# Patient Record
Sex: Female | Born: 1945 | Race: White | Hispanic: No | Smoking: Former smoker
Health system: Southern US, Community
[De-identification: ages and names within clinical notes are randomized; demographics above are authoritative.]

## PROBLEM LIST (undated history)

## (undated) DIAGNOSIS — E039 Hypothyroidism, unspecified: Secondary | ICD-10-CM

## (undated) DIAGNOSIS — F32A Depression, unspecified: Secondary | ICD-10-CM

## (undated) DIAGNOSIS — E785 Hyperlipidemia, unspecified: Secondary | ICD-10-CM

## (undated) DIAGNOSIS — F329 Major depressive disorder, single episode, unspecified: Secondary | ICD-10-CM

## (undated) HISTORY — DX: Hyperlipidemia, unspecified: E78.5

## (undated) HISTORY — PX: THYROID SURGERY: SHX805

## (undated) HISTORY — DX: Hypothyroidism, unspecified: E03.9

## (undated) HISTORY — DX: Depression, unspecified: F32.A

## (undated) HISTORY — PX: TUBAL LIGATION: SHX77

## (undated) HISTORY — DX: Major depressive disorder, single episode, unspecified: F32.9

## (undated) HISTORY — PX: SHOULDER SURGERY: SHX246

---

## 2002-09-26 HISTORY — PX: CATARACT EXTRACTION: SUR2

## 2009-04-23 ENCOUNTER — Emergency Department: Payer: Self-pay | Admitting: Emergency Medicine

## 2011-07-11 ENCOUNTER — Ambulatory Visit: Payer: Self-pay

## 2011-07-14 ENCOUNTER — Emergency Department: Payer: Self-pay | Admitting: Emergency Medicine

## 2011-07-15 ENCOUNTER — Ambulatory Visit: Payer: Self-pay | Admitting: Orthopedic Surgery

## 2012-02-28 LAB — HM PAP SMEAR

## 2012-03-05 LAB — HM COLONOSCOPY

## 2012-03-14 ENCOUNTER — Ambulatory Visit: Payer: Self-pay

## 2012-09-09 ENCOUNTER — Ambulatory Visit: Payer: Self-pay

## 2012-11-03 IMAGING — CT CT CERVICAL SPINE WITHOUT CONTRAST
3 series · 16 of 33 positions shown, 19 images · non-contrast
Comparison: none

REASON FOR EXAM: fall with pain
COMMENTS:

[Series 3: axial · axial · 0.33mm/px · z∈[+1119,+1243]mm · 8 of 83 slices shown, 10 images]
[im 7/83  soft-tissue]
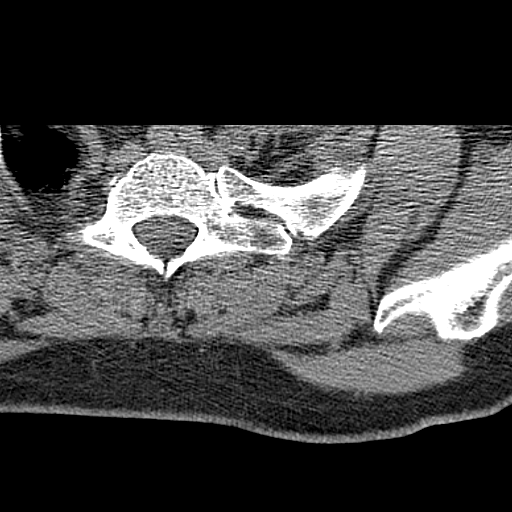
[im 7/83  bone]
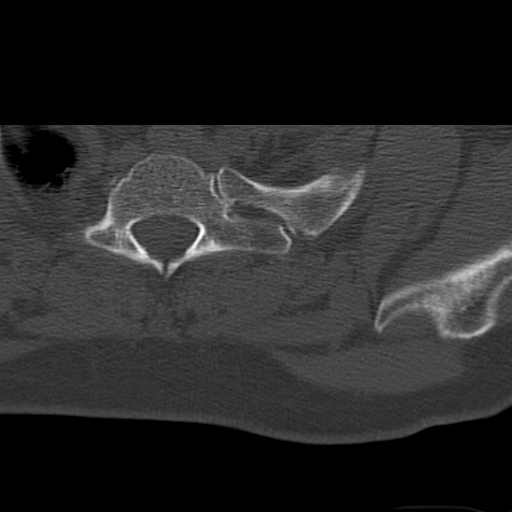
[im 19/83  bone]
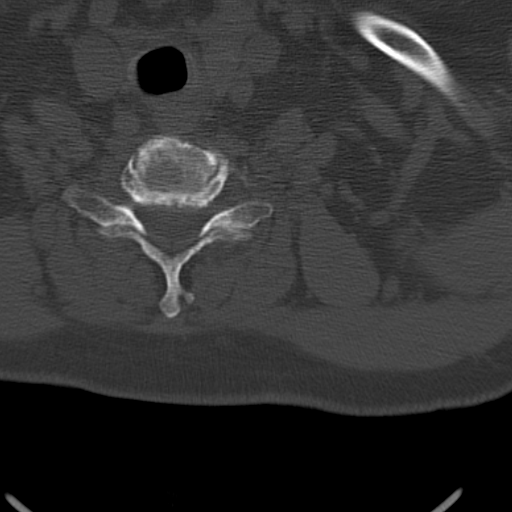
[im 26/83  bone]
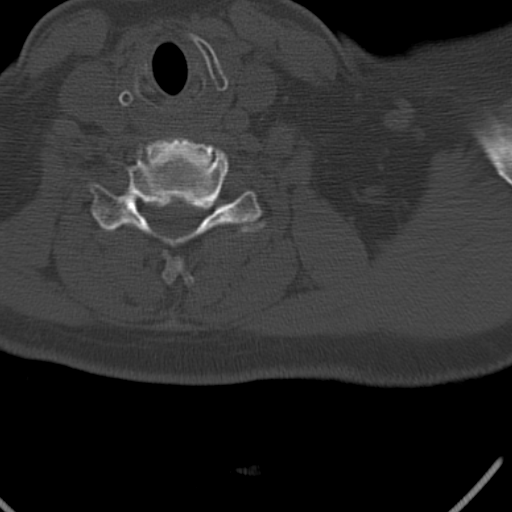
[im 38/83  bone]
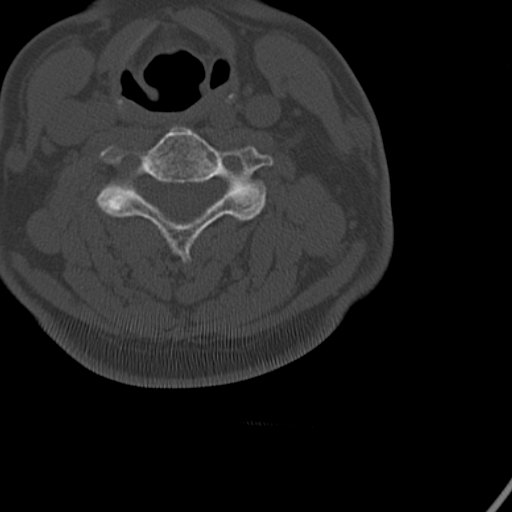
[im 45/83  soft-tissue]
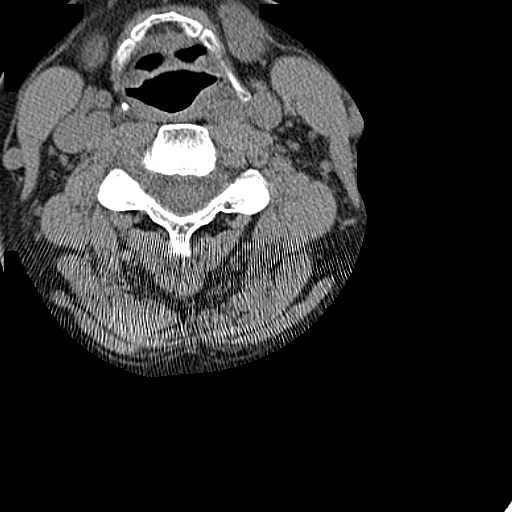
[im 45/83  bone]
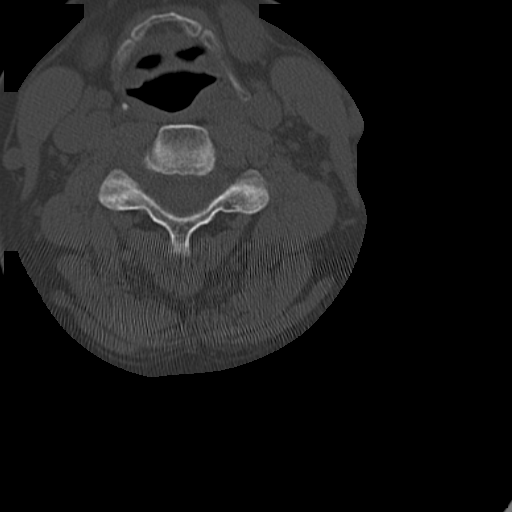
[im 57/83  bone]
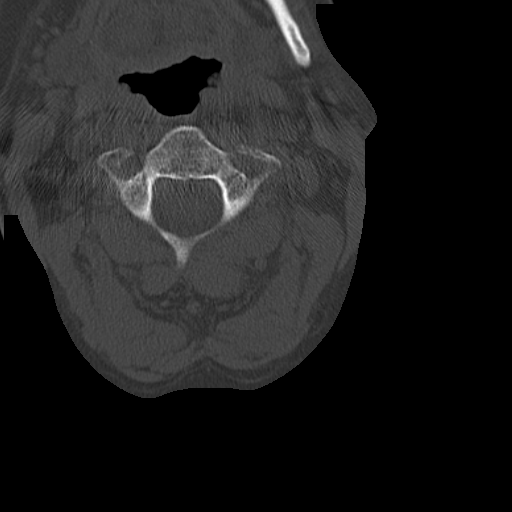
[im 64/83  bone]
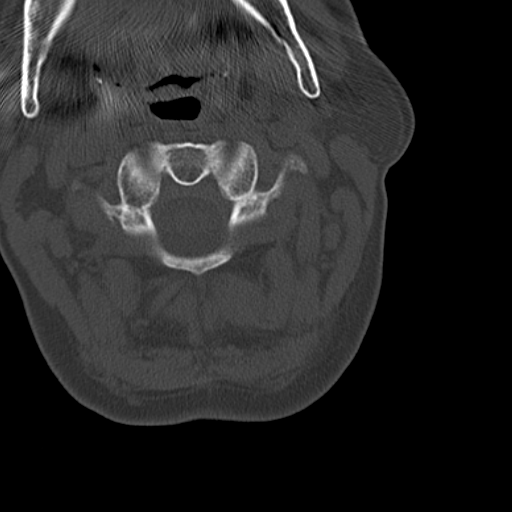
[im 76/83  bone]
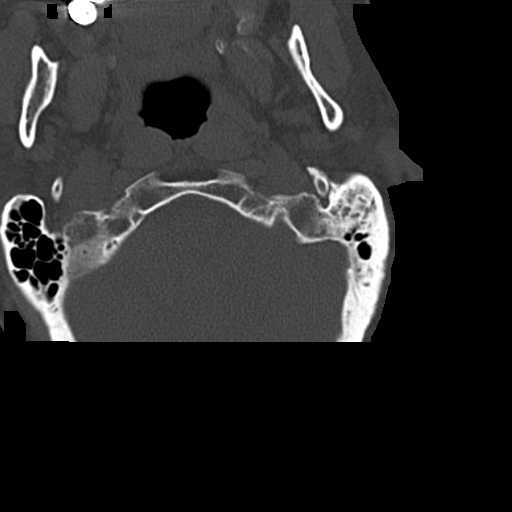

[Series 4: sagittal · sagittal · 0.37mm/px · 5 of 48 slices shown, 6 images]
[im 16/48  bone]
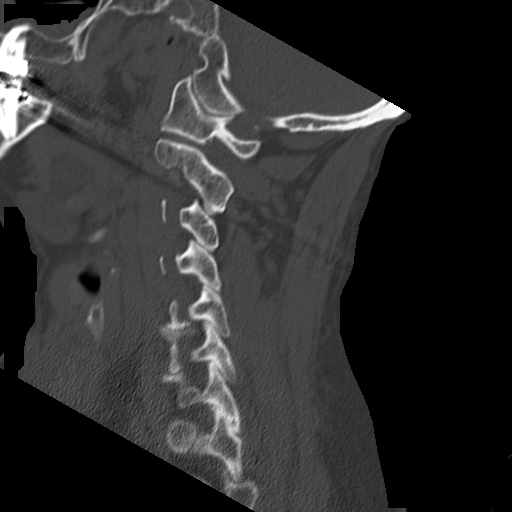
[im 20/48  bone]
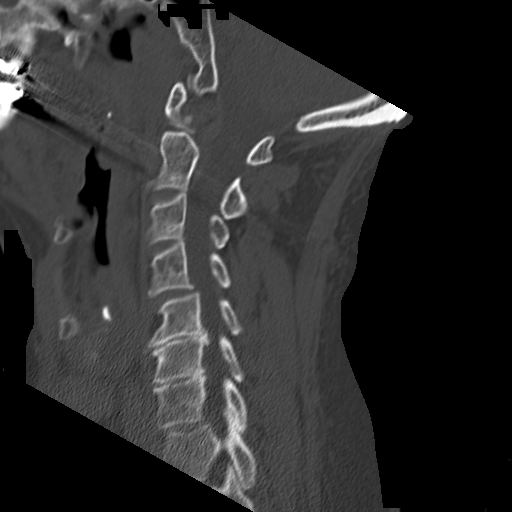
[im 24/48  soft-tissue]
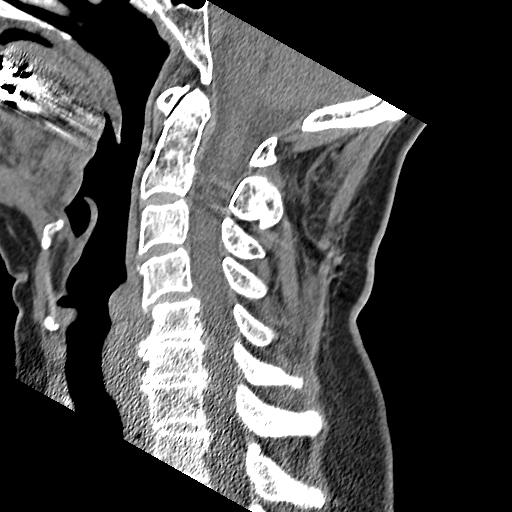
[im 24/48  bone]
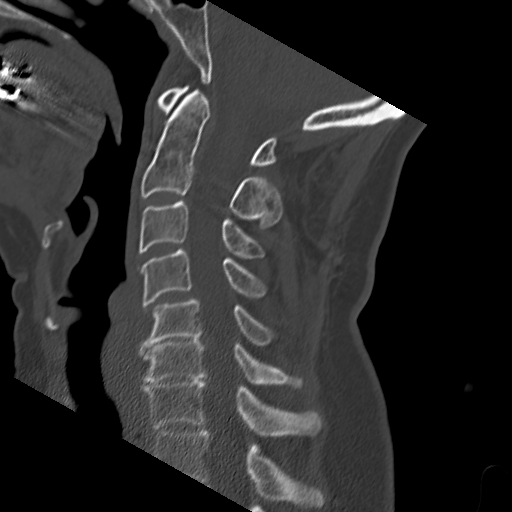
[im 28/48  bone]
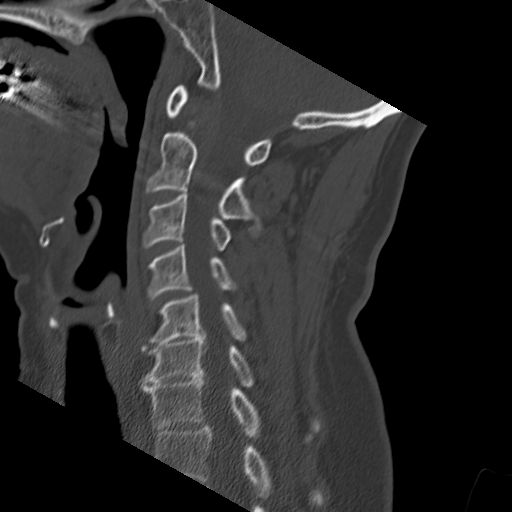
[im 32/48  bone]
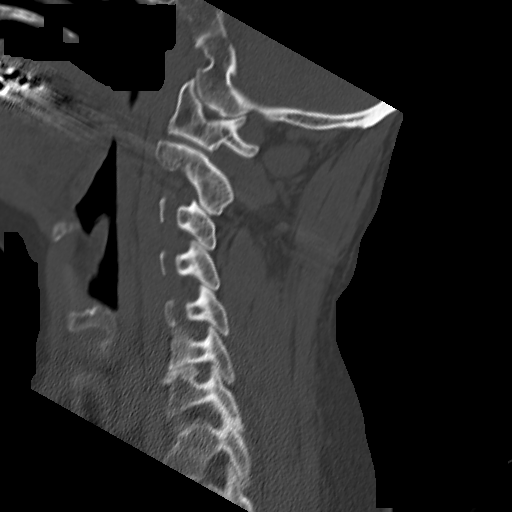

[Series 5: coronal · coronal · 0.38mm/px · 3 of 41 slices shown]
[im 9/41  bone]
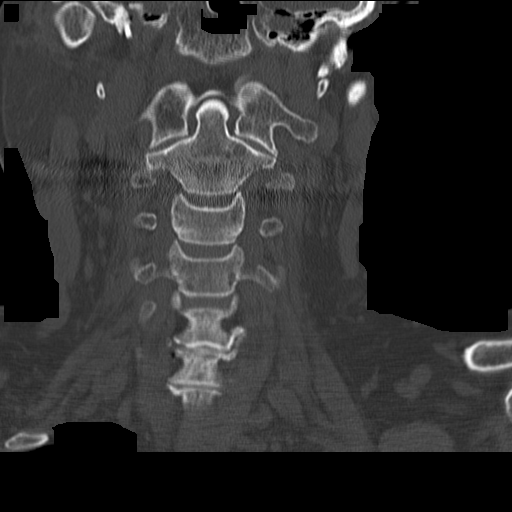
[im 17/41  bone]
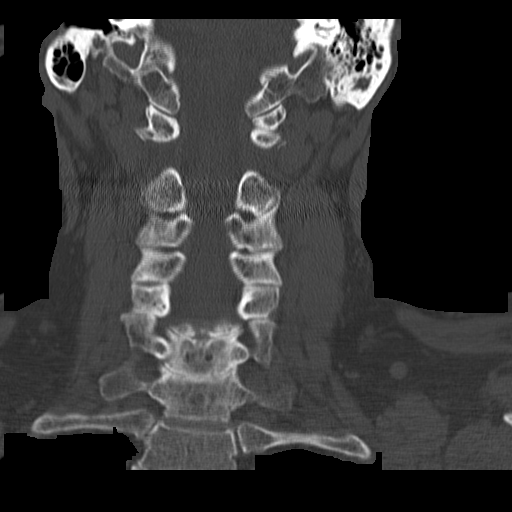
[im 25/41  bone]
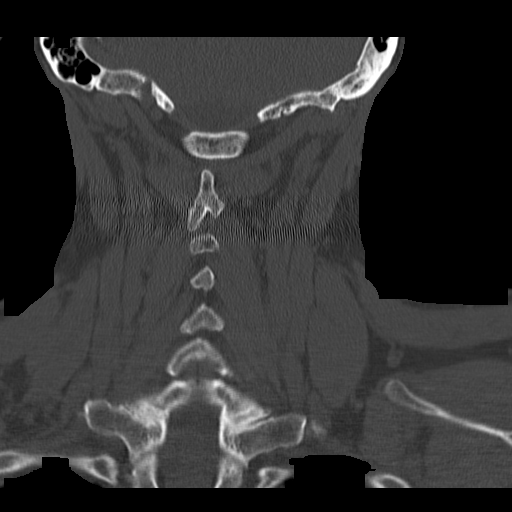

[16 of 33 positions shown; findings below may reference images not displayed]

PROCEDURE:     CT  - CT CERVICAL SPINE WO  - July 14, 2011 [DATE]

RESULT:     Sagittal, axial, and coronal images through the cervical spine
are reviewed.

There is mild loss of the normal cervical lordosis. The cervical vertebral
bodies are preserved in height. There is disc space narrowing at C5-C6 and
at C6-C7. The prevertebral soft tissue spaces appear normal. There is no
evidence of a jumped facet. The spinous processes are intact. The lateral
masses of C1 align normally with those of C2. The odontoid is intact. The
bony ring at each cervical level is intact. There is no bony spinal canal
stenosis. There is pleural thickening in the pulmonary apices bilaterally.
IMPRESSION: 1. I do not see evidence of an acute cervical spine fracture nor
dislocation. Mild reversal of the normal cervical lordosis may reflect
muscle spasm.
2. There is moderate degenerative disc change at C5-C6 and at C6-C7. There
is no bony spinal canal stenosis. At C5-C6 there is mild encroachment upon
the neural foramina bilaterally due to facet joint osteophytes.

## 2012-11-03 IMAGING — CT CT HEAD WITHOUT CONTRAST
2 series · 15 of 30 positions shown, 19 images · non-contrast
Comparison: none

REASON FOR EXAM: pain fall
COMMENTS:

[Series 2: without · axial · non-contrast · 0.44mm/px · z∈[+1265,+1410]mm · 13 of 35 slices shown, 17 images]
[im 3/35  brain]
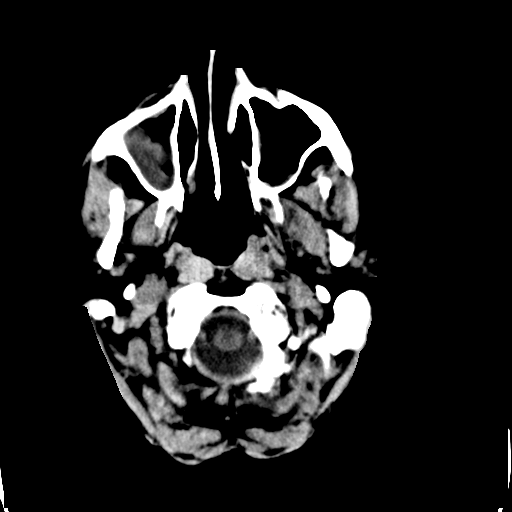
[im 3/35  bone]
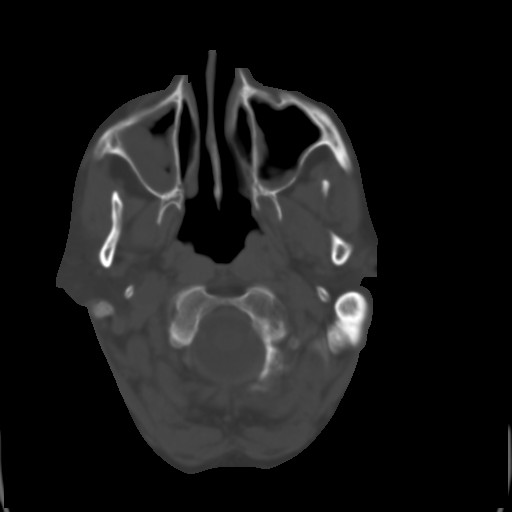
[im 5/35  brain]
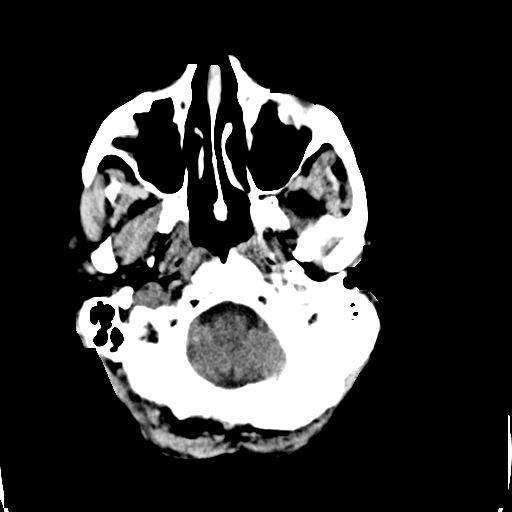
[im 8/35  brain]
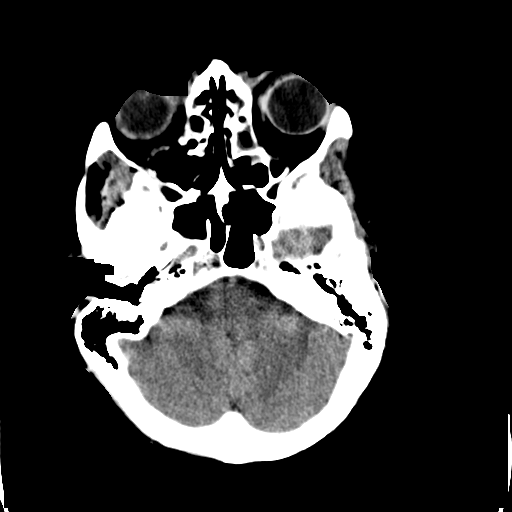
[im 10/35  brain]
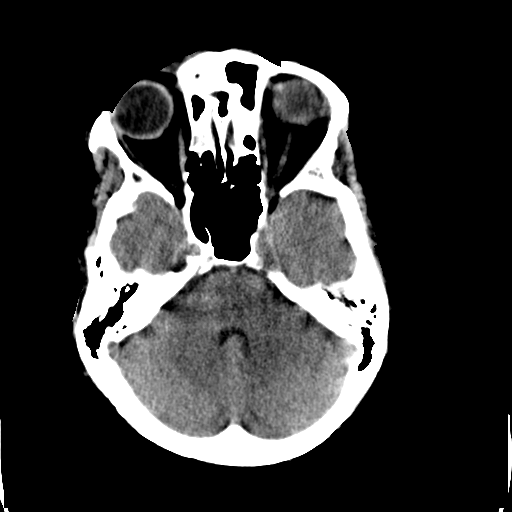
[im 13/35  brain]
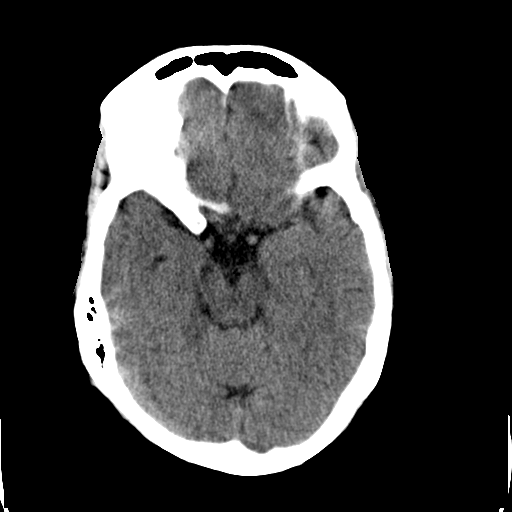
[im 13/35  bone]
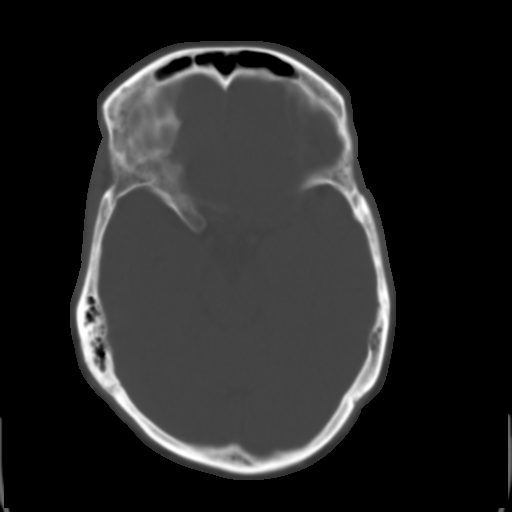
[im 15/35  brain]
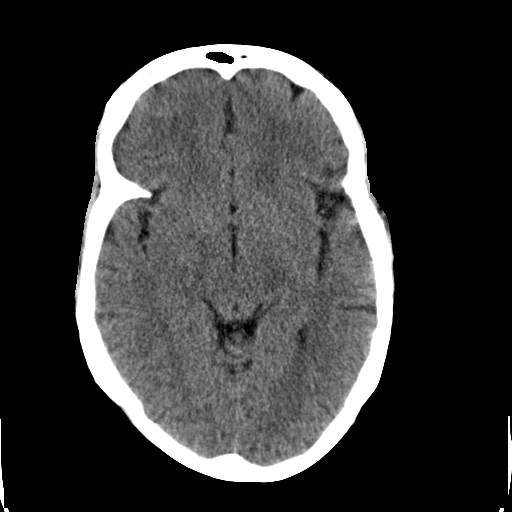
[im 18/35  brain]
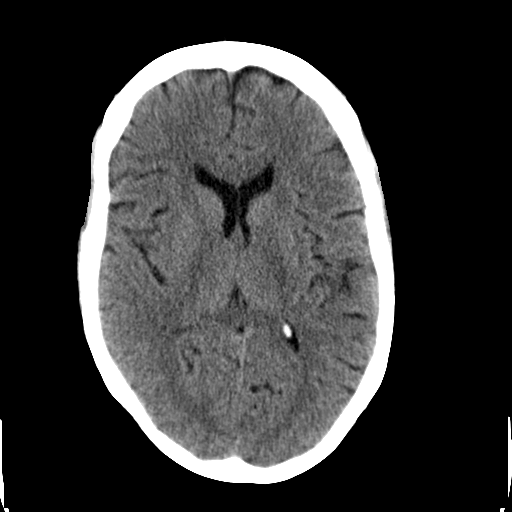
[im 20/35  brain]
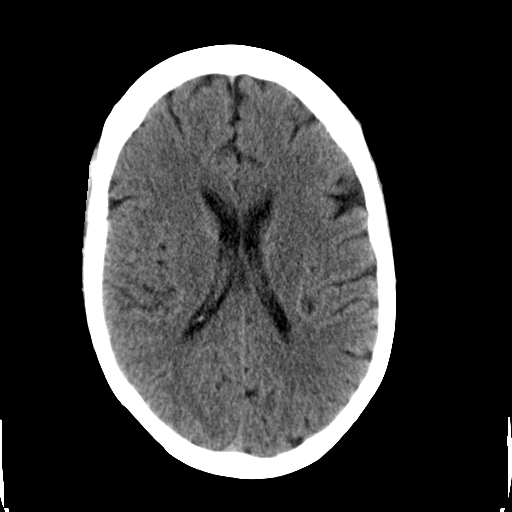
[im 22/35  brain]
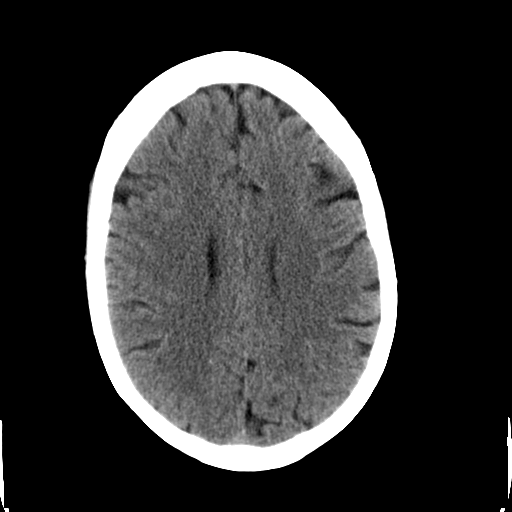
[im 22/35  bone]
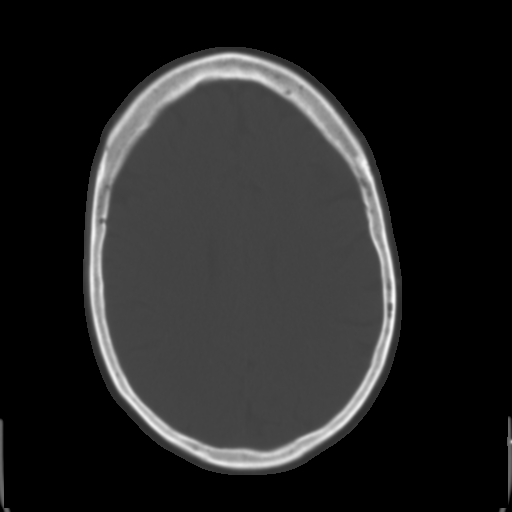
[im 25/35  brain]
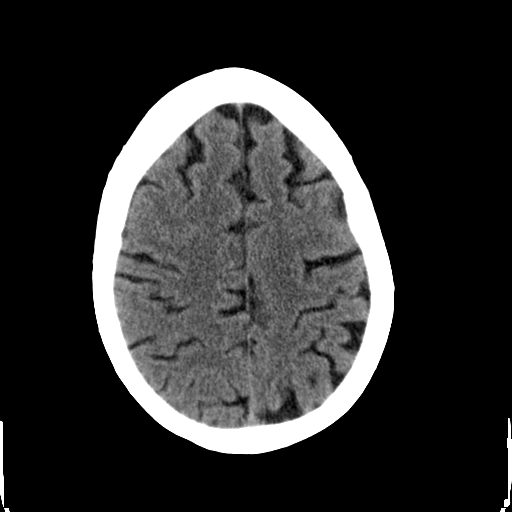
[im 27/35  brain]
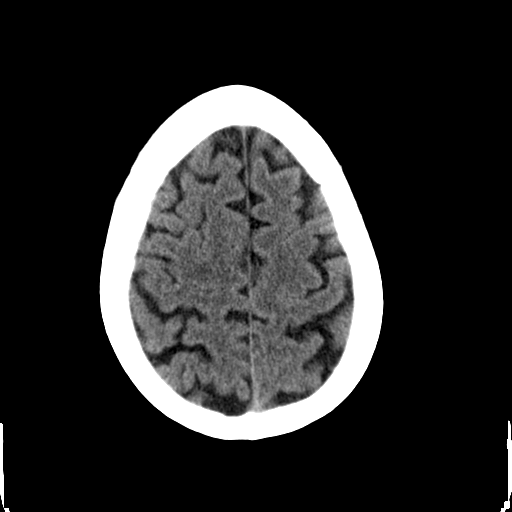
[im 30/35  brain]
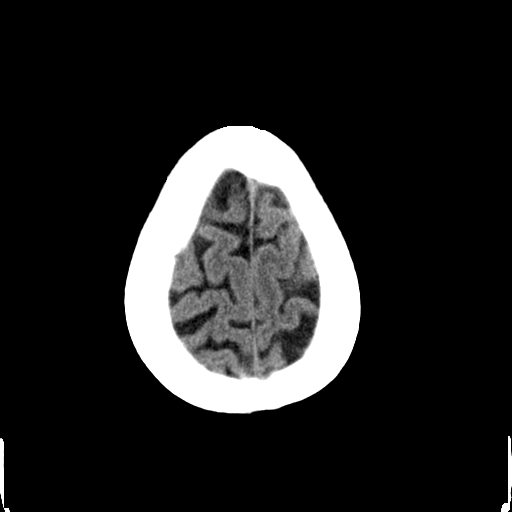
[im 32/35  brain]
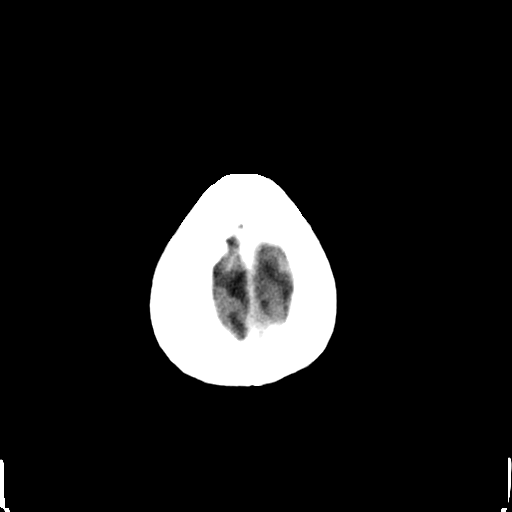
[im 32/35  bone]
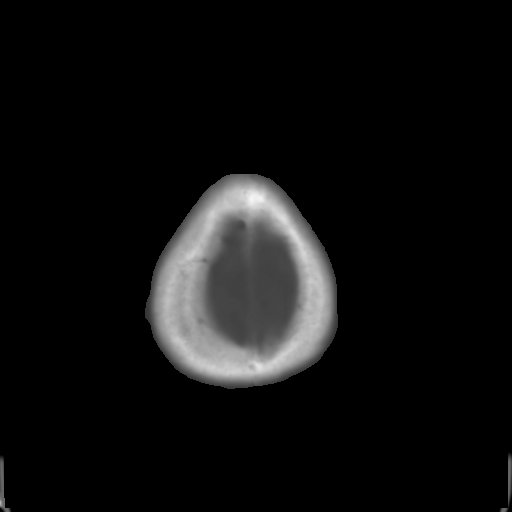

[Series 3: bone · axial · 0.44mm/px · z∈[+1265,+1290]mm · 2 of 35 slices shown]
[im 3/35  bone]
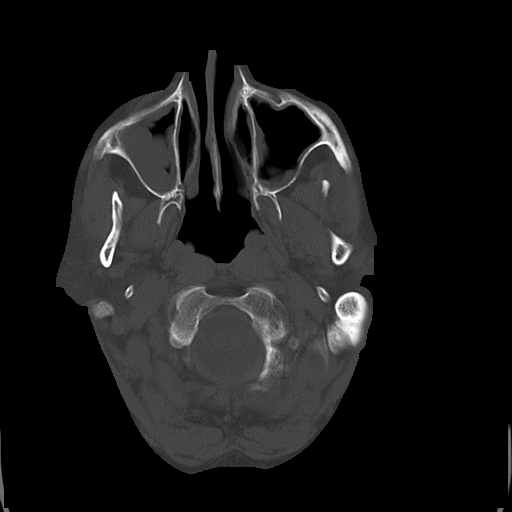
[im 8/35  bone]
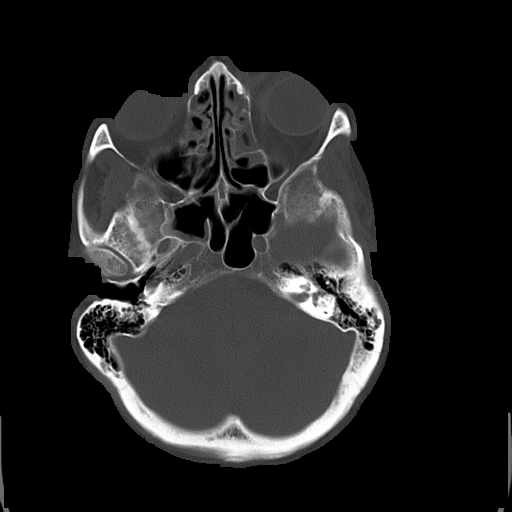

[15 of 30 positions shown; findings below may reference images not displayed]

PROCEDURE:     CT  - CT HEAD WITHOUT CONTRAST  - July 14, 2011 [DATE]

RESULT:     Axial CT scanning was performed through the brain at 5 mm
intervals and slice thicknesses.

The ventricles are normal in size and position. There is no intracranial
hemorrhage nor intracranial mass effect. There is no evidence of an evolving
ischemic infarction. There are punctate basal ganglia calcifications
bilaterally. The cerebellum and brainstem are normal in density. At bone
window settings there is mucoperiosteal thickening within the ethmoid and
maxillary sinuses bilaterally. I see no air-fluid levels. The mastoid air
cells are well pneumatized. There is no evidence of an acute skull fracture.
IMPRESSION: 1. I do not see evidence of an acute intracranial hemorrhage nor other acute
intracranial abnormality.
2. There is no evidence of an acute skull fracture.
3. Soft tissue density in the ethmoid and maxillary sinuses suggests sinus
inflammation.

## 2013-01-08 DIAGNOSIS — J309 Allergic rhinitis, unspecified: Secondary | ICD-10-CM | POA: Diagnosis not present

## 2013-01-08 DIAGNOSIS — M67919 Unspecified disorder of synovium and tendon, unspecified shoulder: Secondary | ICD-10-CM | POA: Diagnosis not present

## 2013-01-08 DIAGNOSIS — M719 Bursopathy, unspecified: Secondary | ICD-10-CM | POA: Diagnosis not present

## 2013-04-02 DIAGNOSIS — E039 Hypothyroidism, unspecified: Secondary | ICD-10-CM | POA: Diagnosis not present

## 2013-04-02 DIAGNOSIS — Z23 Encounter for immunization: Secondary | ICD-10-CM | POA: Diagnosis not present

## 2013-04-02 DIAGNOSIS — I10 Essential (primary) hypertension: Secondary | ICD-10-CM | POA: Diagnosis not present

## 2013-04-02 DIAGNOSIS — Z0000 Encounter for general adult medical examination without abnormal findings: Secondary | ICD-10-CM | POA: Diagnosis not present

## 2013-04-02 DIAGNOSIS — E785 Hyperlipidemia, unspecified: Secondary | ICD-10-CM | POA: Diagnosis not present

## 2014-03-18 DIAGNOSIS — E039 Hypothyroidism, unspecified: Secondary | ICD-10-CM | POA: Diagnosis not present

## 2014-03-18 DIAGNOSIS — B351 Tinea unguium: Secondary | ICD-10-CM | POA: Diagnosis not present

## 2014-03-18 DIAGNOSIS — E785 Hyperlipidemia, unspecified: Secondary | ICD-10-CM | POA: Diagnosis not present

## 2014-11-07 DIAGNOSIS — J019 Acute sinusitis, unspecified: Secondary | ICD-10-CM | POA: Diagnosis not present

## 2014-11-21 DIAGNOSIS — Z0000 Encounter for general adult medical examination without abnormal findings: Secondary | ICD-10-CM | POA: Diagnosis not present

## 2014-11-21 DIAGNOSIS — E780 Pure hypercholesterolemia: Secondary | ICD-10-CM | POA: Diagnosis not present

## 2014-11-21 DIAGNOSIS — Z23 Encounter for immunization: Secondary | ICD-10-CM | POA: Diagnosis not present

## 2014-11-21 DIAGNOSIS — E039 Hypothyroidism, unspecified: Secondary | ICD-10-CM | POA: Diagnosis not present

## 2014-11-21 DIAGNOSIS — J069 Acute upper respiratory infection, unspecified: Secondary | ICD-10-CM | POA: Diagnosis not present

## 2014-12-24 DIAGNOSIS — M8588 Other specified disorders of bone density and structure, other site: Secondary | ICD-10-CM | POA: Diagnosis not present

## 2014-12-24 DIAGNOSIS — Z780 Asymptomatic menopausal state: Secondary | ICD-10-CM | POA: Diagnosis not present

## 2014-12-24 LAB — HM DEXA SCAN

## 2015-03-18 ENCOUNTER — Ambulatory Visit (INDEPENDENT_AMBULATORY_CARE_PROVIDER_SITE_OTHER): Payer: Medicare Other | Admitting: Unknown Physician Specialty

## 2015-03-18 ENCOUNTER — Telehealth: Payer: Self-pay | Admitting: Unknown Physician Specialty

## 2015-03-18 ENCOUNTER — Encounter: Payer: Self-pay | Admitting: Unknown Physician Specialty

## 2015-03-18 VITALS — BP 117/79 | HR 91 | Temp 99.1°F | Ht 67.5 in | Wt 228.8 lb

## 2015-03-18 DIAGNOSIS — F419 Anxiety disorder, unspecified: Secondary | ICD-10-CM | POA: Insufficient documentation

## 2015-03-18 DIAGNOSIS — J069 Acute upper respiratory infection, unspecified: Secondary | ICD-10-CM

## 2015-03-18 DIAGNOSIS — E785 Hyperlipidemia, unspecified: Secondary | ICD-10-CM | POA: Insufficient documentation

## 2015-03-18 DIAGNOSIS — J45909 Unspecified asthma, uncomplicated: Secondary | ICD-10-CM | POA: Insufficient documentation

## 2015-03-18 DIAGNOSIS — F329 Major depressive disorder, single episode, unspecified: Secondary | ICD-10-CM | POA: Insufficient documentation

## 2015-03-18 DIAGNOSIS — E063 Autoimmune thyroiditis: Secondary | ICD-10-CM | POA: Insufficient documentation

## 2015-03-18 DIAGNOSIS — F32A Depression, unspecified: Secondary | ICD-10-CM | POA: Insufficient documentation

## 2015-03-18 DIAGNOSIS — E039 Hypothyroidism, unspecified: Secondary | ICD-10-CM | POA: Insufficient documentation

## 2015-03-18 MED ORDER — BENZONATATE 200 MG PO CAPS: 200.0000 mg | ORAL_CAPSULE | Freq: Two times a day (BID) | ORAL | Status: DC | PRN

## 2015-03-18 NOTE — Telephone Encounter (Signed)
Pt has been added to Cheryl's schedule today @ 2pm for an acute visit. Thanks.

## 2015-03-18 NOTE — Patient Instructions (Signed)
Upper Respiratory Infection, Adult An upper respiratory infection (URI) is also sometimes known as the common cold. The upper respiratory tract includes the nose, sinuses, throat, trachea, and bronchi. Bronchi are the airways leading to the lungs. Most people improve within 1 week, but symptoms can last up to 2 weeks. A residual cough may last even longer.  CAUSES Many different viruses can infect the tissues lining the upper respiratory tract. The tissues become irritated and inflamed and often become very moist. Mucus production is also common. A cold is contagious. You can easily spread the virus to others by oral contact. This includes kissing, sharing a glass, coughing, or sneezing. Touching your mouth or nose and then touching a surface, which is then touched by another person, can also spread the virus. SYMPTOMS  Symptoms typically develop 1 to 3 days after you come in contact with a cold virus. Symptoms vary from person to person. They may include:  Runny nose.  Sneezing.  Nasal congestion.  Sinus irritation.  Sore throat.  Loss of voice (laryngitis).  Cough.  Fatigue.  Muscle aches.  Loss of appetite.  Headache.  Low-grade fever. DIAGNOSIS  You might diagnose your own cold based on familiar symptoms, since most people get a cold 2 to 3 times a year. Your caregiver can confirm this based on your exam. Most importantly, your caregiver can check that your symptoms are not due to another disease such as strep throat, sinusitis, pneumonia, asthma, or epiglottitis. Blood tests, throat tests, and X-rays are not necessary to diagnose a common cold, but they may sometimes be helpful in excluding other more serious diseases. Your caregiver will decide if any further tests are required. RISKS AND COMPLICATIONS  You may be at risk for a more severe case of the common cold if you smoke cigarettes, have chronic heart disease (such as heart failure) or lung disease (such as asthma), or if  you have a weakened immune system. The very young and very old are also at risk for more serious infections. Bacterial sinusitis, middle ear infections, and bacterial pneumonia can complicate the common cold. The common cold can worsen asthma and chronic obstructive pulmonary disease (COPD). Sometimes, these complications can require emergency medical care and may be life-threatening. PREVENTION  The best way to protect against getting a cold is to practice good hygiene. Avoid oral or hand contact with people with cold symptoms. Wash your hands often if contact occurs. There is no clear evidence that vitamin C, vitamin E, echinacea, or exercise reduces the chance of developing a cold. However, it is always recommended to get plenty of rest and practice good nutrition. TREATMENT  Treatment is directed at relieving symptoms. There is no cure. Antibiotics are not effective, because the infection is caused by a virus, not by bacteria. Treatment may include:  Increased fluid intake. Sports drinks offer valuable electrolytes, sugars, and fluids.  Breathing heated mist or steam (vaporizer or shower).  Eating chicken soup or other clear broths, and maintaining good nutrition.  Getting plenty of rest.  Using gargles or lozenges for comfort.  Controlling fevers with ibuprofen or acetaminophen as directed by your caregiver.  Increasing usage of your inhaler if you have asthma. Zinc gel and zinc lozenges, taken in the first 24 hours of the common cold, can shorten the duration and lessen the severity of symptoms. Pain medicines may help with fever, muscle aches, and throat pain. A variety of non-prescription medicines are available to treat congestion and runny nose. Your caregiver   can make recommendations and may suggest nasal or lung inhalers for other symptoms.  HOME CARE INSTRUCTIONS   Only take over-the-counter or prescription medicines for pain, discomfort, or fever as directed by your  caregiver.  Use a warm mist humidifier or inhale steam from a shower to increase air moisture. This may keep secretions moist and make it easier to breathe.  Drink enough water and fluids to keep your urine clear or pale yellow.  Rest as needed.  Return to work when your temperature has returned to normal or as your caregiver advises. You may need to stay home longer to avoid infecting others. You can also use a face mask and careful hand washing to prevent spread of the virus. SEEK MEDICAL CARE IF:   After the first few days, you feel you are getting worse rather than better.  You need your caregiver's advice about medicines to control symptoms.  You develop chills, worsening shortness of breath, or brown or red sputum. These may be signs of pneumonia.  You develop yellow or brown nasal discharge or pain in the face, especially when you bend forward. These may be signs of sinusitis.  You develop a fever, swollen neck glands, pain with swallowing, or white areas in the back of your throat. These may be signs of strep throat. SEEK IMMEDIATE MEDICAL CARE IF:   You have a fever.  You develop severe or persistent headache, ear pain, sinus pain, or chest pain.  You develop wheezing, a prolonged cough, cough up blood, or have a change in your usual mucus (if you have chronic lung disease).  You develop sore muscles or a stiff neck. Document Released: 03/08/2001 Document Revised: 12/05/2011 Document Reviewed: 12/18/2013 [employer] Patient Information 2015 [employer]. This information is not intended to replace advice given to you by your health care provider. Make sure you discuss any questions you have with your health care provider.  

## 2015-03-18 NOTE — Progress Notes (Signed)
   BP 117/79 mmHg  Pulse 91  Temp(Src) 99.1 F (37.3 C)  Ht 5' 7.5" (1.715 m)  Wt 228 lb 12.8 oz (103.783 kg)  BMI 35.29 kg/m2  SpO2 97%  LMP  (LMP Unknown)   Subjective:    Patient ID: Wanda White, female    DOB: 28-Nov-1945, 69 y.o.   MRN: [id number]  HPI: Wanda White is a 69 y.o. female  Chief Complaint  Patient presents with  . Facial Pain  . Ear Pain  . Nasal Congestion  . Cough   URI  This is a new problem. The current episode started yesterday. The problem has been rapidly worsening. There has been no fever. Associated symptoms include chest pain, congestion, coughing and headaches. Pertinent negatives include no abdominal pain, diarrhea, dysuria, ear pain or wheezing. She has tried decongestant for the symptoms. The treatment provided mild relief.    Relevant past medical, surgical, family and social history reviewed and updated as indicated. Interim medical history since our last visit reviewed. Allergies and medications reviewed and updated.  Review of Systems  HENT: Positive for congestion. Negative for ear pain.   Respiratory: Positive for cough. Negative for wheezing.   Cardiovascular: Positive for chest pain.  Gastrointestinal: Negative for abdominal pain and diarrhea.  Genitourinary: Negative for dysuria.  Neurological: Positive for headaches.    Per HPI unless specifically indicated above     Objective:    BP 117/79 mmHg  Pulse 91  Temp(Src) 99.1 F (37.3 C)  Ht 5' 7.5" (1.715 m)  Wt 228 lb 12.8 oz (103.783 kg)  BMI 35.29 kg/m2  SpO2 97%  LMP  (LMP Unknown)  Wt Readings from Last 3 Encounters:  03/18/15 228 lb 12.8 oz (103.783 kg)  11/21/14 227 lb (102.967 kg)    Physical Exam  Constitutional: She is oriented to person, place, and time. She appears well-developed and well-nourished. No distress.  HENT:  Head: Normocephalic and atraumatic.  Right Ear: Tympanic membrane normal.  Left Ear: Tympanic membrane normal.  Nose: Mucosal edema  and rhinorrhea present.  Eyes: Conjunctivae and lids are normal. Right eye exhibits no discharge. Left eye exhibits no discharge. No scleral icterus.  Cardiovascular: Normal rate and regular rhythm.   Pulmonary/Chest: Effort normal. No respiratory distress.  Abdominal: Normal appearance. There is no splenomegaly or hepatomegaly. There is no tenderness.  Musculoskeletal: Normal range of motion.  Neurological: She is alert and oriented to person, place, and time.  Skin: Skin is intact. No rash noted. No pallor.  Psychiatric: She has a normal mood and affect. Her behavior is normal. Judgment and thought content normal.    No results found for this or any previous visit.    Assessment & Plan:   Problem List Items Addressed This Visit    None    Visit Diagnoses    URI (upper respiratory infection)    -  Primary    rx for Tessalon Perles        Follow up plan: Return if symptoms worsen or fail to improve.   Pt ed given on viruses.  Encouraged saline nasal flushes, fluids, and rest.

## 2015-05-22 ENCOUNTER — Other Ambulatory Visit: Payer: Self-pay | Admitting: Unknown Physician Specialty

## 2015-07-23 DIAGNOSIS — G8929 Other chronic pain: Secondary | ICD-10-CM | POA: Diagnosis not present

## 2015-07-23 DIAGNOSIS — M25512 Pain in left shoulder: Secondary | ICD-10-CM | POA: Diagnosis not present

## 2015-07-23 DIAGNOSIS — S42202A Unspecified fracture of upper end of left humerus, initial encounter for closed fracture: Secondary | ICD-10-CM | POA: Diagnosis not present

## 2015-07-23 DIAGNOSIS — Y33XXXA Other specified events, undetermined intent, initial encounter: Secondary | ICD-10-CM | POA: Diagnosis not present

## 2015-08-24 ENCOUNTER — Other Ambulatory Visit: Payer: Self-pay | Admitting: Unknown Physician Specialty

## 2015-11-17 DIAGNOSIS — Y33XXXD Other specified events, undetermined intent, subsequent encounter: Secondary | ICD-10-CM | POA: Diagnosis not present

## 2015-11-17 DIAGNOSIS — M87022 Idiopathic aseptic necrosis of left humerus: Secondary | ICD-10-CM | POA: Diagnosis not present

## 2015-11-17 DIAGNOSIS — S42202A Unspecified fracture of upper end of left humerus, initial encounter for closed fracture: Secondary | ICD-10-CM | POA: Diagnosis not present

## 2015-11-17 DIAGNOSIS — Z9889 Other specified postprocedural states: Secondary | ICD-10-CM | POA: Diagnosis not present

## 2015-11-20 ENCOUNTER — Other Ambulatory Visit: Payer: Self-pay | Admitting: Unknown Physician Specialty

## 2015-11-20 NOTE — Telephone Encounter (Signed)
Pt needs seen.

## 2015-12-02 DIAGNOSIS — S42202D Unspecified fracture of upper end of left humerus, subsequent encounter for fracture with routine healing: Secondary | ICD-10-CM | POA: Diagnosis not present

## 2015-12-02 DIAGNOSIS — Z01818 Encounter for other preprocedural examination: Secondary | ICD-10-CM | POA: Diagnosis not present

## 2015-12-02 DIAGNOSIS — M87022 Idiopathic aseptic necrosis of left humerus: Secondary | ICD-10-CM | POA: Diagnosis not present

## 2015-12-02 DIAGNOSIS — Y33XXXD Other specified events, undetermined intent, subsequent encounter: Secondary | ICD-10-CM | POA: Diagnosis not present

## 2015-12-03 ENCOUNTER — Encounter: Payer: Self-pay | Admitting: *Deleted

## 2016-01-01 DIAGNOSIS — M87022 Idiopathic aseptic necrosis of left humerus: Secondary | ICD-10-CM | POA: Diagnosis not present

## 2016-01-11 NOTE — Telephone Encounter (Signed)
Pt scheduled for medication follow up 01/15/16 @ 10:30am. Thanks.

## 2016-01-15 ENCOUNTER — Ambulatory Visit: Payer: Medicare Other | Admitting: Unknown Physician Specialty

## 2016-01-19 ENCOUNTER — Ambulatory Visit (INDEPENDENT_AMBULATORY_CARE_PROVIDER_SITE_OTHER): Payer: Medicare Other | Admitting: Unknown Physician Specialty

## 2016-01-19 ENCOUNTER — Encounter: Payer: Self-pay | Admitting: Unknown Physician Specialty

## 2016-01-19 VITALS — BP 137/83 | HR 76 | Temp 97.6°F | Ht 67.5 in | Wt 235.4 lb

## 2016-01-19 DIAGNOSIS — E039 Hypothyroidism, unspecified: Secondary | ICD-10-CM

## 2016-01-19 DIAGNOSIS — Z01818 Encounter for other preprocedural examination: Secondary | ICD-10-CM | POA: Diagnosis not present

## 2016-01-19 DIAGNOSIS — E785 Hyperlipidemia, unspecified: Secondary | ICD-10-CM

## 2016-01-19 DIAGNOSIS — I10 Essential (primary) hypertension: Secondary | ICD-10-CM | POA: Diagnosis not present

## 2016-01-19 NOTE — Progress Notes (Signed)
   BP 137/83 mmHg  Pulse 76  Temp(Src) 97.6 F (36.4 C)  Ht 5' 7.5" (1.715 m)  Wt 235 lb 6.4 oz (106.777 kg)  BMI 36.30 kg/m2  SpO2 98%  LMP  (LMP Unknown)   Subjective:    Patient ID: Wanda White, female    DOB: [birth year], 70 y.o.   MRN: [id number]  HPI: Wanda White is a 70 y.o. female  Chief Complaint  Patient presents with  . Hyperlipidemia  . Hypothyroidism   Hyperlipidemia Using medications without problems: No Muscle aches  Diet compliance: Exercise:  Hypothyroid Some weight gain due to inactivity.    Shoulder  Planning on getting a second shoulder surgery following "shattering it" secondary to a fall.  She states it will be a shoulder replacement.     Relevant past medical, surgical, family and social history reviewed and updated as indicated. Interim medical history since our last visit reviewed. Allergies and medications reviewed and updated.  Review of Systems  Per HPI unless specifically indicated above     Objective:    BP 137/83 mmHg  Pulse 76  Temp(Src) 97.6 F (36.4 C)  Ht 5' 7.5" (1.715 m)  Wt 235 lb 6.4 oz (106.777 kg)  BMI 36.30 kg/m2  SpO2 98%  LMP  (LMP Unknown)  Wt Readings from Last 3 Encounters:  01/19/16 235 lb 6.4 oz (106.777 kg)  03/18/15 228 lb 12.8 oz (103.783 kg)  11/21/14 227 lb (102.967 kg)    Physical Exam  Constitutional: She is oriented to person, place, and time. She appears well-developed and well-nourished. No distress.  HENT:  Head: Normocephalic and atraumatic.  Eyes: Conjunctivae and lids are normal. Right eye exhibits no discharge. Left eye exhibits no discharge. No scleral icterus.  Neck: Normal range of motion. Neck supple. No JVD present. Carotid bruit is not present.  Cardiovascular: Normal rate, regular rhythm and normal heart sounds.   Pulmonary/Chest: Effort normal and breath sounds normal.  Abdominal: Normal appearance. There is no splenomegaly or hepatomegaly.  Musculoskeletal: Normal range of  motion.  Neurological: She is alert and oriented to person, place, and time.  Skin: Skin is warm, dry and intact. No rash noted. No pallor.  Psychiatric: She has a normal mood and affect. Her behavior is normal. Judgment and thought content normal.      Assessment & Plan:   Problem List Items Addressed This Visit      Unprioritized   Hyperlipidemia    Check cholesterol levels      Relevant Orders   Lipid Panel w/o Chol/HDL Ratio   Hypertension    Monitor at home      Relevant Orders   Comprehensive metabolic panel   Hypothyroid - Primary    Check TSH      Relevant Orders   TSH    Other Visit Diagnoses    Preop examination        Relevant Orders    CBC        Follow up plan: Return in about 6 months (around 07/20/2016).

## 2016-01-19 NOTE — Patient Instructions (Signed)
DASH Eating Plan  DASH stands for "Dietary Approaches to Stop Hypertension." The DASH eating plan is a healthy eating plan that has been shown to reduce high blood pressure (hypertension). Additional health benefits may include reducing the risk of type 2 diabetes mellitus, heart disease, and stroke. The DASH eating plan may also help with weight loss.  WHAT DO I NEED TO KNOW ABOUT THE DASH EATING PLAN?  For the DASH eating plan, you will follow these general guidelines:  · Choose foods with a percent daily value for sodium of less than 5% (as listed on the food label).  · Use salt-free seasonings or herbs instead of table salt or sea salt.  · Check with your health care provider or pharmacist before using salt substitutes.  · Eat lower-sodium products, often labeled as "lower sodium" or "no salt added."  · Eat fresh foods.  · Eat more vegetables, fruits, and low-fat dairy products.  · Choose whole grains. Look for the word "whole" as the first word in the ingredient list.  · Choose fish and skinless chicken or turkey more often than red meat. Limit fish, poultry, and meat to 6 oz (170 g) each day.  · Limit sweets, desserts, sugars, and sugary drinks.  · Choose heart-healthy fats.  · Limit cheese to 1 oz (28 g) per day.  · Eat more home-cooked food and less restaurant, buffet, and fast food.  · Limit fried foods.  · Cook foods using methods other than frying.  · Limit canned vegetables. If you do use them, rinse them well to decrease the sodium.  · When eating at a restaurant, ask that your food be prepared with less salt, or no salt if possible.  WHAT FOODS CAN I EAT?  Seek help from a dietitian for individual calorie needs.  Grains  Whole grain or whole wheat bread. Brown rice. Whole grain or whole wheat pasta. Quinoa, bulgur, and whole grain cereals. Low-sodium cereals. Corn or whole wheat flour tortillas. Whole grain cornbread. Whole grain crackers. Low-sodium crackers.  Vegetables  Fresh or frozen vegetables  (raw, steamed, roasted, or grilled). Low-sodium or reduced-sodium tomato and vegetable juices. Low-sodium or reduced-sodium tomato sauce and paste. Low-sodium or reduced-sodium canned vegetables.   Fruits  All fresh, canned (in natural juice), or frozen fruits.  Meat and Other Protein Products  Ground beef (85% or leaner), grass-fed beef, or beef trimmed of fat. Skinless chicken or turkey. Ground chicken or turkey. Pork trimmed of fat. All fish and seafood. Eggs. Dried beans, peas, or lentils. Unsalted nuts and seeds. Unsalted canned beans.  Dairy  Low-fat dairy products, such as skim or 1% milk, 2% or reduced-fat cheeses, low-fat ricotta or cottage cheese, or plain low-fat yogurt. Low-sodium or reduced-sodium cheeses.  Fats and Oils  Tub margarines without trans fats. Light or reduced-fat mayonnaise and salad dressings (reduced sodium). Avocado. Safflower, olive, or canola oils. Natural peanut or almond butter.  Other  Unsalted popcorn and pretzels.  The items listed above may not be a complete list of recommended foods or beverages. Contact your dietitian for more options.  WHAT FOODS ARE NOT RECOMMENDED?  Grains  White bread. White pasta. White rice. Refined cornbread. Bagels and croissants. Crackers that contain trans fat.  Vegetables  Creamed or fried vegetables. Vegetables in a cheese sauce. Regular canned vegetables. Regular canned tomato sauce and paste. Regular tomato and vegetable juices.  Fruits  Dried fruits. Canned fruit in light or heavy syrup. Fruit juice.  Meat and Other Protein   Products  Fatty cuts of meat. Ribs, chicken wings, bacon, sausage, bologna, salami, chitterlings, fatback, hot dogs, bratwurst, and packaged luncheon meats. Salted nuts and seeds. Canned beans with salt.  Dairy  Whole or 2% milk, cream, half-and-half, and cream cheese. Whole-fat or sweetened yogurt. Full-fat cheeses or blue cheese. Nondairy creamers and whipped toppings. Processed cheese, cheese spreads, or cheese  curds.  Condiments  Onion and garlic salt, seasoned salt, table salt, and sea salt. Canned and packaged gravies. Worcestershire sauce. Tartar sauce. Barbecue sauce. Teriyaki sauce. Soy sauce, including reduced sodium. Steak sauce. Fish sauce. Oyster sauce. Cocktail sauce. Horseradish. Ketchup and mustard. Meat flavorings and tenderizers. Bouillon cubes. Hot sauce. Tabasco sauce. Marinades. Taco seasonings. Relishes.  Fats and Oils  Butter, stick margarine, lard, shortening, ghee, and bacon fat. Coconut, palm kernel, or palm oils. Regular salad dressings.  Other  Pickles and olives. Salted popcorn and pretzels.  The items listed above may not be a complete list of foods and beverages to avoid. Contact your dietitian for more information.  WHERE CAN I FIND MORE INFORMATION?  National [employer], [employer], [employer] Institute: www.nhlbi.nih.gov/health/health-topics/topics/dash/     This information is not intended to replace advice given to you by your health care provider. Make sure you discuss any questions you have with your health care provider.     Document Released: 09/01/2011 Document Revised: 10/03/2014 Document Reviewed: 07/17/2013  Elsevier Interactive Patient Education ©2016 [employer]

## 2016-01-19 NOTE — Assessment & Plan Note (Signed)
Monitor at home

## 2016-01-19 NOTE — Assessment & Plan Note (Signed)
Check TSH 

## 2016-01-19 NOTE — Assessment & Plan Note (Signed)
Check cholesterol levels 

## 2016-01-20 LAB — COMPREHENSIVE METABOLIC PANEL
A/G RATIO: 2 (ref 1.2–2.2)
ALBUMIN: 4.8 g/dL (ref 3.6–4.8)
ALK PHOS: 73 IU/L (ref 39–117)
ALT: 16 IU/L (ref 0–32)
AST: 17 IU/L (ref 0–40)
BILIRUBIN TOTAL: 0.3 mg/dL (ref 0.0–1.2)
BUN / CREAT RATIO: 20 (ref 12–28)
BUN: 18 mg/dL (ref 8–27)
CHLORIDE: 104 mmol/L (ref 96–106)
CO2: 20 mmol/L (ref 18–29)
Calcium: 9.8 mg/dL (ref 8.7–10.3)
Creatinine, Ser: 0.92 mg/dL (ref 0.57–1.00)
GFR calc Af Amer: 74 mL/min/{1.73_m2} (ref >59)
GFR calc non Af Amer: 64 mL/min/{1.73_m2} (ref >59)
GLUCOSE: 99 mg/dL (ref 65–99)
Globulin, Total: 2.4 g/dL (ref 1.5–4.5)
POTASSIUM: 5 mmol/L (ref 3.5–5.2)
SODIUM: 144 mmol/L (ref 134–144)
Total Protein: 7.2 g/dL (ref 6.0–8.5)

## 2016-01-20 LAB — LIPID PANEL W/O CHOL/HDL RATIO
CHOLESTEROL TOTAL: 185 mg/dL (ref 100–199)
HDL: 58 mg/dL (ref >39)
LDL Calculated: 101 mg/dL — ABNORMAL HIGH (ref 0–99)
Triglycerides: 129 mg/dL (ref 0–149)
VLDL CHOLESTEROL CAL: 26 mg/dL (ref 5–40)

## 2016-01-20 LAB — CBC
HEMATOCRIT: 37.2 % (ref 34.0–46.6)
HEMOGLOBIN: 12.4 g/dL (ref 11.1–15.9)
MCH: 30.6 pg (ref 26.6–33.0)
MCHC: 33.3 g/dL (ref 31.5–35.7)
MCV: 92 fL (ref 79–97)
PLATELETS: 243 10*3/uL (ref 150–379)
RBC: 4.05 x10E6/uL (ref 3.77–5.28)
RDW: 15.4 % (ref 12.3–15.4)
WBC: 4.5 10*3/uL (ref 3.4–10.8)

## 2016-01-20 LAB — TSH: TSH: 2.5 u[IU]/mL (ref 0.450–4.500)

## 2016-01-25 DIAGNOSIS — Z9851 Tubal ligation status: Secondary | ICD-10-CM | POA: Diagnosis not present

## 2016-01-25 DIAGNOSIS — S42202S Unspecified fracture of upper end of left humerus, sequela: Secondary | ICD-10-CM | POA: Diagnosis not present

## 2016-01-25 DIAGNOSIS — M19012 Primary osteoarthritis, left shoulder: Secondary | ICD-10-CM | POA: Diagnosis present

## 2016-01-25 DIAGNOSIS — M87022 Idiopathic aseptic necrosis of left humerus: Secondary | ICD-10-CM | POA: Diagnosis present

## 2016-01-25 DIAGNOSIS — M25512 Pain in left shoulder: Secondary | ICD-10-CM | POA: Diagnosis present

## 2016-01-25 DIAGNOSIS — G8918 Other acute postprocedural pain: Secondary | ICD-10-CM | POA: Diagnosis not present

## 2016-01-25 DIAGNOSIS — Z6835 Body mass index (BMI) 35.0-35.9, adult: Secondary | ICD-10-CM | POA: Diagnosis not present

## 2016-01-25 DIAGNOSIS — L905 Scar conditions and fibrosis of skin: Secondary | ICD-10-CM | POA: Diagnosis not present

## 2016-01-25 DIAGNOSIS — Z7722 Contact with and (suspected) exposure to environmental tobacco smoke (acute) (chronic): Secondary | ICD-10-CM | POA: Diagnosis present

## 2016-01-25 DIAGNOSIS — Z96612 Presence of left artificial shoulder joint: Secondary | ICD-10-CM | POA: Diagnosis not present

## 2016-01-25 DIAGNOSIS — E669 Obesity, unspecified: Secondary | ICD-10-CM | POA: Diagnosis present

## 2016-01-25 DIAGNOSIS — Z471 Aftercare following joint replacement surgery: Secondary | ICD-10-CM | POA: Diagnosis not present

## 2016-01-25 DIAGNOSIS — E039 Hypothyroidism, unspecified: Secondary | ICD-10-CM | POA: Diagnosis present

## 2016-01-25 DIAGNOSIS — K219 Gastro-esophageal reflux disease without esophagitis: Secondary | ICD-10-CM | POA: Diagnosis present

## 2016-01-25 DIAGNOSIS — Z780 Asymptomatic menopausal state: Secondary | ICD-10-CM | POA: Diagnosis not present

## 2016-01-25 DIAGNOSIS — M1990 Unspecified osteoarthritis, unspecified site: Secondary | ICD-10-CM | POA: Diagnosis present

## 2016-01-25 DIAGNOSIS — E785 Hyperlipidemia, unspecified: Secondary | ICD-10-CM | POA: Diagnosis present

## 2016-01-25 HISTORY — PX: JOINT REPLACEMENT: SHX530

## 2016-02-02 DIAGNOSIS — M25512 Pain in left shoulder: Secondary | ICD-10-CM | POA: Diagnosis not present

## 2016-02-02 DIAGNOSIS — R29898 Other symptoms and signs involving the musculoskeletal system: Secondary | ICD-10-CM | POA: Diagnosis not present

## 2016-02-02 DIAGNOSIS — Z471 Aftercare following joint replacement surgery: Secondary | ICD-10-CM | POA: Diagnosis not present

## 2016-02-02 DIAGNOSIS — Z96612 Presence of left artificial shoulder joint: Secondary | ICD-10-CM | POA: Diagnosis not present

## 2016-02-05 DIAGNOSIS — S42292D Other displaced fracture of upper end of left humerus, subsequent encounter for fracture with routine healing: Secondary | ICD-10-CM | POA: Diagnosis not present

## 2016-02-05 DIAGNOSIS — Z96612 Presence of left artificial shoulder joint: Secondary | ICD-10-CM | POA: Diagnosis not present

## 2016-02-05 DIAGNOSIS — Y33XXXD Other specified events, undetermined intent, subsequent encounter: Secondary | ICD-10-CM | POA: Diagnosis not present

## 2016-02-08 DIAGNOSIS — Z96612 Presence of left artificial shoulder joint: Secondary | ICD-10-CM | POA: Diagnosis not present

## 2016-02-08 DIAGNOSIS — R29898 Other symptoms and signs involving the musculoskeletal system: Secondary | ICD-10-CM | POA: Diagnosis not present

## 2016-02-08 DIAGNOSIS — Z471 Aftercare following joint replacement surgery: Secondary | ICD-10-CM | POA: Diagnosis not present

## 2016-02-08 DIAGNOSIS — M25512 Pain in left shoulder: Secondary | ICD-10-CM | POA: Diagnosis not present

## 2016-02-16 DIAGNOSIS — Z96612 Presence of left artificial shoulder joint: Secondary | ICD-10-CM | POA: Diagnosis not present

## 2016-02-16 DIAGNOSIS — Z471 Aftercare following joint replacement surgery: Secondary | ICD-10-CM | POA: Diagnosis not present

## 2016-02-16 DIAGNOSIS — M25512 Pain in left shoulder: Secondary | ICD-10-CM | POA: Diagnosis not present

## 2016-02-16 DIAGNOSIS — R29898 Other symptoms and signs involving the musculoskeletal system: Secondary | ICD-10-CM | POA: Diagnosis not present

## 2016-02-23 ENCOUNTER — Other Ambulatory Visit: Payer: Self-pay | Admitting: Unknown Physician Specialty

## 2016-02-23 DIAGNOSIS — Z471 Aftercare following joint replacement surgery: Secondary | ICD-10-CM | POA: Diagnosis not present

## 2016-02-23 DIAGNOSIS — R29898 Other symptoms and signs involving the musculoskeletal system: Secondary | ICD-10-CM | POA: Diagnosis not present

## 2016-02-23 DIAGNOSIS — M25512 Pain in left shoulder: Secondary | ICD-10-CM | POA: Diagnosis not present

## 2016-02-23 DIAGNOSIS — Z96612 Presence of left artificial shoulder joint: Secondary | ICD-10-CM | POA: Diagnosis not present

## 2016-03-01 DIAGNOSIS — Z471 Aftercare following joint replacement surgery: Secondary | ICD-10-CM | POA: Diagnosis not present

## 2016-03-01 DIAGNOSIS — Z96612 Presence of left artificial shoulder joint: Secondary | ICD-10-CM | POA: Diagnosis not present

## 2016-03-07 DIAGNOSIS — Z471 Aftercare following joint replacement surgery: Secondary | ICD-10-CM | POA: Diagnosis not present

## 2016-03-07 DIAGNOSIS — Z96612 Presence of left artificial shoulder joint: Secondary | ICD-10-CM | POA: Diagnosis not present

## 2016-03-08 DIAGNOSIS — Z96612 Presence of left artificial shoulder joint: Secondary | ICD-10-CM | POA: Diagnosis not present

## 2016-03-08 DIAGNOSIS — Z471 Aftercare following joint replacement surgery: Secondary | ICD-10-CM | POA: Diagnosis not present

## 2016-03-15 DIAGNOSIS — Z471 Aftercare following joint replacement surgery: Secondary | ICD-10-CM | POA: Diagnosis not present

## 2016-03-15 DIAGNOSIS — Z96612 Presence of left artificial shoulder joint: Secondary | ICD-10-CM | POA: Diagnosis not present

## 2016-03-17 DIAGNOSIS — Z471 Aftercare following joint replacement surgery: Secondary | ICD-10-CM | POA: Diagnosis not present

## 2016-03-17 DIAGNOSIS — Z96612 Presence of left artificial shoulder joint: Secondary | ICD-10-CM | POA: Diagnosis not present

## 2016-03-22 DIAGNOSIS — Z471 Aftercare following joint replacement surgery: Secondary | ICD-10-CM | POA: Diagnosis not present

## 2016-03-22 DIAGNOSIS — Z96612 Presence of left artificial shoulder joint: Secondary | ICD-10-CM | POA: Diagnosis not present

## 2016-03-24 DIAGNOSIS — Z96612 Presence of left artificial shoulder joint: Secondary | ICD-10-CM | POA: Diagnosis not present

## 2016-03-24 DIAGNOSIS — Z471 Aftercare following joint replacement surgery: Secondary | ICD-10-CM | POA: Diagnosis not present

## 2016-03-31 DIAGNOSIS — M25512 Pain in left shoulder: Secondary | ICD-10-CM | POA: Diagnosis not present

## 2016-03-31 DIAGNOSIS — Z471 Aftercare following joint replacement surgery: Secondary | ICD-10-CM | POA: Diagnosis not present

## 2016-03-31 DIAGNOSIS — R29898 Other symptoms and signs involving the musculoskeletal system: Secondary | ICD-10-CM | POA: Diagnosis not present

## 2016-03-31 DIAGNOSIS — Z96612 Presence of left artificial shoulder joint: Secondary | ICD-10-CM | POA: Diagnosis not present

## 2016-04-05 DIAGNOSIS — M25512 Pain in left shoulder: Secondary | ICD-10-CM | POA: Diagnosis not present

## 2016-04-05 DIAGNOSIS — Z96612 Presence of left artificial shoulder joint: Secondary | ICD-10-CM | POA: Diagnosis not present

## 2016-04-05 DIAGNOSIS — Z471 Aftercare following joint replacement surgery: Secondary | ICD-10-CM | POA: Diagnosis not present

## 2016-04-05 DIAGNOSIS — R29898 Other symptoms and signs involving the musculoskeletal system: Secondary | ICD-10-CM | POA: Diagnosis not present

## 2016-04-11 DIAGNOSIS — M25512 Pain in left shoulder: Secondary | ICD-10-CM | POA: Diagnosis not present

## 2016-04-11 DIAGNOSIS — Z96612 Presence of left artificial shoulder joint: Secondary | ICD-10-CM | POA: Diagnosis not present

## 2016-04-11 DIAGNOSIS — Z471 Aftercare following joint replacement surgery: Secondary | ICD-10-CM | POA: Diagnosis not present

## 2016-04-11 DIAGNOSIS — R29898 Other symptoms and signs involving the musculoskeletal system: Secondary | ICD-10-CM | POA: Diagnosis not present

## 2016-04-19 DIAGNOSIS — R29898 Other symptoms and signs involving the musculoskeletal system: Secondary | ICD-10-CM | POA: Diagnosis not present

## 2016-04-19 DIAGNOSIS — M25512 Pain in left shoulder: Secondary | ICD-10-CM | POA: Diagnosis not present

## 2016-04-19 DIAGNOSIS — Z471 Aftercare following joint replacement surgery: Secondary | ICD-10-CM | POA: Diagnosis not present

## 2016-04-19 DIAGNOSIS — Z96612 Presence of left artificial shoulder joint: Secondary | ICD-10-CM | POA: Diagnosis not present

## 2016-04-25 DIAGNOSIS — Z471 Aftercare following joint replacement surgery: Secondary | ICD-10-CM | POA: Diagnosis not present

## 2016-04-25 DIAGNOSIS — R29898 Other symptoms and signs involving the musculoskeletal system: Secondary | ICD-10-CM | POA: Diagnosis not present

## 2016-04-25 DIAGNOSIS — Z96612 Presence of left artificial shoulder joint: Secondary | ICD-10-CM | POA: Diagnosis not present

## 2016-04-25 DIAGNOSIS — M25512 Pain in left shoulder: Secondary | ICD-10-CM | POA: Diagnosis not present

## 2016-05-09 DIAGNOSIS — Z96612 Presence of left artificial shoulder joint: Secondary | ICD-10-CM | POA: Diagnosis not present

## 2016-05-09 DIAGNOSIS — M87022 Idiopathic aseptic necrosis of left humerus: Secondary | ICD-10-CM | POA: Diagnosis not present

## 2016-05-09 DIAGNOSIS — M25512 Pain in left shoulder: Secondary | ICD-10-CM | POA: Diagnosis not present

## 2016-05-11 DIAGNOSIS — R29898 Other symptoms and signs involving the musculoskeletal system: Secondary | ICD-10-CM | POA: Diagnosis not present

## 2016-05-11 DIAGNOSIS — Z471 Aftercare following joint replacement surgery: Secondary | ICD-10-CM | POA: Diagnosis not present

## 2016-05-11 DIAGNOSIS — Z96612 Presence of left artificial shoulder joint: Secondary | ICD-10-CM | POA: Diagnosis not present

## 2016-05-11 DIAGNOSIS — M25512 Pain in left shoulder: Secondary | ICD-10-CM | POA: Diagnosis not present

## 2016-05-18 ENCOUNTER — Other Ambulatory Visit: Payer: Self-pay | Admitting: Unknown Physician Specialty

## 2016-05-19 ENCOUNTER — Other Ambulatory Visit: Payer: Self-pay | Admitting: Unknown Physician Specialty

## 2016-05-19 MED ORDER — SIMVASTATIN 40 MG PO TABS: 40.0000 mg | ORAL_TABLET | Freq: Every day | ORAL | 0 refills | Status: DC

## 2016-05-19 MED ORDER — LEVOTHYROXINE SODIUM 50 MCG PO TABS: 50.0000 ug | ORAL_TABLET | Freq: Every day | ORAL | 0 refills | Status: DC

## 2016-05-25 DIAGNOSIS — M25512 Pain in left shoulder: Secondary | ICD-10-CM | POA: Diagnosis not present

## 2016-05-25 DIAGNOSIS — Z471 Aftercare following joint replacement surgery: Secondary | ICD-10-CM | POA: Diagnosis not present

## 2016-05-25 DIAGNOSIS — R29898 Other symptoms and signs involving the musculoskeletal system: Secondary | ICD-10-CM | POA: Diagnosis not present

## 2016-05-25 DIAGNOSIS — Z96612 Presence of left artificial shoulder joint: Secondary | ICD-10-CM | POA: Diagnosis not present

## 2016-06-15 DIAGNOSIS — Z96612 Presence of left artificial shoulder joint: Secondary | ICD-10-CM | POA: Diagnosis not present

## 2016-06-15 DIAGNOSIS — Z471 Aftercare following joint replacement surgery: Secondary | ICD-10-CM | POA: Diagnosis not present

## 2016-07-20 ENCOUNTER — Encounter: Payer: Medicare Other | Admitting: Unknown Physician Specialty

## 2016-08-21 ENCOUNTER — Other Ambulatory Visit: Payer: Self-pay | Admitting: Unknown Physician Specialty

## 2016-08-22 ENCOUNTER — Other Ambulatory Visit: Payer: Self-pay | Admitting: Unknown Physician Specialty

## 2016-08-22 MED ORDER — LEVOTHYROXINE SODIUM 50 MCG PO TABS: 50.0000 ug | ORAL_TABLET | Freq: Every day | ORAL | 0 refills | Status: DC

## 2016-08-22 MED ORDER — SIMVASTATIN 40 MG PO TABS: 40.0000 mg | ORAL_TABLET | Freq: Every day | ORAL | 0 refills | Status: DC

## 2016-08-24 DIAGNOSIS — Z23 Encounter for immunization: Secondary | ICD-10-CM | POA: Diagnosis not present

## 2016-09-02 ENCOUNTER — Encounter: Payer: Self-pay | Admitting: Unknown Physician Specialty

## 2016-09-02 ENCOUNTER — Ambulatory Visit (INDEPENDENT_AMBULATORY_CARE_PROVIDER_SITE_OTHER): Payer: Medicare Other | Admitting: Unknown Physician Specialty

## 2016-09-02 VITALS — BP 114/74 | HR 81 | Temp 98.3°F | Ht 68.0 in | Wt 228.0 lb

## 2016-09-02 DIAGNOSIS — Z5181 Encounter for therapeutic drug level monitoring: Secondary | ICD-10-CM

## 2016-09-02 DIAGNOSIS — Z96612 Presence of left artificial shoulder joint: Secondary | ICD-10-CM

## 2016-09-02 DIAGNOSIS — R634 Abnormal weight loss: Secondary | ICD-10-CM | POA: Diagnosis not present

## 2016-09-02 DIAGNOSIS — Z0000 Encounter for general adult medical examination without abnormal findings: Secondary | ICD-10-CM

## 2016-09-02 DIAGNOSIS — E782 Mixed hyperlipidemia: Secondary | ICD-10-CM | POA: Diagnosis not present

## 2016-09-02 DIAGNOSIS — E039 Hypothyroidism, unspecified: Secondary | ICD-10-CM

## 2016-09-02 DIAGNOSIS — Z1239 Encounter for other screening for malignant neoplasm of breast: Secondary | ICD-10-CM

## 2016-09-02 MED ORDER — SIMVASTATIN 40 MG PO TABS: 40.0000 mg | ORAL_TABLET | Freq: Every day | ORAL | 3 refills | Status: DC

## 2016-09-02 MED ORDER — LEVOTHYROXINE SODIUM 50 MCG PO TABS: 50.0000 ug | ORAL_TABLET | Freq: Every day | ORAL | 3 refills | Status: DC

## 2016-09-02 NOTE — Patient Instructions (Signed)
Please do call to schedule your mammogram; the number to schedule one at either [facility] or [facility] is [phone]   

## 2016-09-02 NOTE — Progress Notes (Signed)
BP 114/74 (BP Location: Left Arm, Patient Position: Sitting, Cuff Size: Large)   Pulse 81   Temp 98.3 F (36.8 C)   Ht 5\' 8"  (1.727 m)   Wt 228 lb (103.4 kg)   LMP  (LMP Unknown)   SpO2 99%   BMI 34.67 kg/m    Subjective:    Patient ID: Wanda HartmannKathy C Meloy, female    DOB: 1946/04/16, 70 y.o.   MRN: [id number]  HPI: Wanda White is a 70 y.o. female  Chief Complaint  Patient presents with  . Medicare Wellness   Functional Status Survey: Is the patient deaf or have difficulty hearing?: No Does the patient have difficulty seeing, even when wearing glasses/contacts?: No Does the patient have difficulty concentrating, remembering, or making decisions?: No Does the patient have difficulty walking or climbing stairs?: No Does the patient have difficulty dressing or bathing?: No Does the patient have difficulty doing errands alone such as visiting a doctor's office or shopping?: No  Fall Risk  09/02/2016 01/19/2016 09/18/2015  Falls in the past year? No No No   Social History   Social History  . Marital status: Widowed    Spouse name: N/A  . Number of children: N/A  . Years of education: N/A   Occupational History  . Not on file.   Social History Main Topics  . Smoking status: Former Games developermoker  . Smokeless tobacco: Never Used  . Alcohol use Yes     Comment: occasionally  . Drug use: No  . Sexual activity: No   Other Topics Concern  . Not on file   Social History Narrative  . No narrative on file   Family History  Problem Relation Age of Onset  . Cancer Mother [id number]    breast  . Thyroid disease Mother   . CAD Father   . Hypertension Father   . Heart attack Father   . Heart attack Maternal Grandfather   . Heart disease Paternal Grandmother   . Lung disease Paternal Grandfather   . Cancer Sister     breast  . Hypertension Sister    Past Medical History:  Diagnosis Date  . Depression   . Hyperlipidemia   . Hypothyroid    Past Surgical History:  Procedure  Laterality Date  . CATARACT EXTRACTION  2004  . JOINT REPLACEMENT  01/2016   left shoulder  . SHOULDER SURGERY    . THYROID SURGERY     gland removed  . TUBAL LIGATION      Hyperlipidemia Using medications without problems: No Muscle aches  Diet compliance: Lost about 7-8 pounds Exercise: Keeps grandson and walks and walks dog.    Thyroid Not weight loss.  Poor appetite Relevant past medical, surgical, family and social history reviewed and updated as indicated. Interim medical history since our last visit reviewed. Allergies and medications reviewed and updated.  Review of Systems  Constitutional: Negative.   HENT: Negative.   Eyes: Negative.   Respiratory: Negative.   Cardiovascular: Negative.   Gastrointestinal: Negative.   Endocrine: Negative.   Genitourinary: Negative.   Musculoskeletal: Negative.   Skin: Negative.   Allergic/Immunologic: Negative.   Neurological: Negative.   Hematological: Negative.   Psychiatric/Behavioral: Negative.     Per HPI unless specifically indicated above     Objective:    BP 114/74 (BP Location: Left Arm, Patient Position: Sitting, Cuff Size: Large)   Pulse 81   Temp 98.3 F (36.8 C)   Ht 5\' 8"  (1.727 m)  Wt 228 lb (103.4 kg)   LMP  (LMP Unknown)   SpO2 99%   BMI 34.67 kg/m   Wt Readings from Last 3 Encounters:  09/02/16 228 lb (103.4 kg)  01/19/16 235 lb 6.4 oz (106.8 kg)  03/18/15 228 lb 12.8 oz (103.8 kg)    Physical Exam  Constitutional: She is oriented to person, place, and time. She appears well-developed and well-nourished.  HENT:  Head: Normocephalic and atraumatic.  Eyes: Pupils are equal, round, and reactive to light. Right eye exhibits no discharge. Left eye exhibits no discharge. No scleral icterus.  Neck: Normal range of motion. Neck supple. Carotid bruit is not present. No thyromegaly present.  Cardiovascular: Normal rate, regular rhythm and normal heart sounds.  Exam reveals no gallop and no friction  rub.   No murmur heard. Pulmonary/Chest: Effort normal and breath sounds normal. No respiratory distress. She has no wheezes. She has no rales.  Abdominal: Soft. Bowel sounds are normal. She exhibits mass. There is no tenderness. There is no rebound.  Genitourinary: No breast swelling, tenderness or discharge.  Musculoskeletal: Normal range of motion.  Lymphadenopathy:    She has no cervical adenopathy.  Neurological: She is alert and oriented to person, place, and time.  Skin: Skin is warm, dry and intact. No rash noted.  Psychiatric: She has a normal mood and affect. Her speech is normal and behavior is normal. Judgment and thought content normal. Cognition and memory are normal.    Results for orders placed or performed in visit on 01/19/16  Comprehensive metabolic panel  Result Value Ref Range   Glucose 99 65 - 99 mg/dL   BUN 18 8 - 27 mg/dL   Creatinine, Ser 1.61 0.57 - 1.00 mg/dL   GFR calc non Af Amer 64 >59 mL/min/1.73   GFR calc Af Amer 74 >59 mL/min/1.73   BUN/Creatinine Ratio 20 12 - 28   Sodium 144 134 - 144 mmol/L   Potassium 5.0 3.5 - 5.2 mmol/L   Chloride 104 96 - 106 mmol/L   CO2 20 18 - 29 mmol/L   Calcium 9.8 8.7 - 10.3 mg/dL   Total Protein 7.2 6.0 - 8.5 g/dL   Albumin 4.8 3.6 - 4.8 g/dL   Globulin, Total 2.4 1.5 - 4.5 g/dL   Albumin/Globulin Ratio 2.0 1.2 - 2.2   Bilirubin Total 0.3 0.0 - 1.2 mg/dL   Alkaline Phosphatase 73 39 - 117 IU/L   AST 17 0 - 40 IU/L   ALT 16 0 - 32 IU/L  Lipid Panel w/o Chol/HDL Ratio  Result Value Ref Range   Cholesterol, Total 185 100 - 199 mg/dL   Triglycerides 096 0 - 149 mg/dL   HDL 58 >04 mg/dL   VLDL Cholesterol Cal 26 5 - 40 mg/dL   LDL Calculated 540 (H) 0 - 99 mg/dL  TSH  Result Value Ref Range   TSH 2.500 0.450 - 4.500 uIU/mL  CBC  Result Value Ref Range   WBC 4.5 3.4 - 10.8 x10E3/uL   RBC 4.05 3.77 - 5.28 x10E6/uL   Hemoglobin 12.4 11.1 - 15.9 g/dL   Hematocrit 98.1 19.1 - 46.6 %   MCV 92 79 - 97 fL   MCH  30.6 26.6 - 33.0 pg   MCHC 33.3 31.5 - 35.7 g/dL   RDW 47.8 29.5 - 62.1 %   Platelets 243 150 - 379 x10E3/uL      Assessment & Plan:   Problem List Items Addressed This Visit  Unprioritized   History of left shoulder replacement   Hyperlipidemia   Relevant Medications   aspirin EC 81 MG tablet   simvastatin (ZOCOR) 40 MG tablet   Other Relevant Orders   Lipid Panel w/o Chol/HDL Ratio   Hypothyroid - Primary   Relevant Medications   levothyroxine (SYNTHROID, LEVOTHROID) 50 MCG tablet   Other Relevant Orders   TSH    Other Visit Diagnoses    Annual physical exam       Medication monitoring encounter       Relevant Orders   Comprehensive metabolic panel   Weight loss       Relevant Orders   CBC with Differential/Platelet   Breast cancer screening       Relevant Orders   MM DIGITAL SCREENING BILATERAL       Follow up plan: Return in about 6 months (around 03/03/2017).

## 2016-09-03 LAB — COMPREHENSIVE METABOLIC PANEL
ALK PHOS: 70 IU/L (ref 39–117)
ALT: 21 IU/L (ref 0–32)
AST: 23 IU/L (ref 0–40)
Albumin/Globulin Ratio: 1.8 (ref 1.2–2.2)
Albumin: 4.7 g/dL (ref 3.6–4.8)
BUN/Creatinine Ratio: 15 (ref 12–28)
BUN: 15 mg/dL (ref 8–27)
Bilirubin Total: 0.4 mg/dL (ref 0.0–1.2)
CO2: 25 mmol/L (ref 18–29)
CREATININE: 1 mg/dL (ref 0.57–1.00)
Calcium: 9.6 mg/dL (ref 8.7–10.3)
Chloride: 98 mmol/L (ref 96–106)
GFR calc Af Amer: 67 mL/min/{1.73_m2} (ref >59)
GFR calc non Af Amer: 58 mL/min/{1.73_m2} — ABNORMAL LOW (ref >59)
GLUCOSE: 99 mg/dL (ref 65–99)
Globulin, Total: 2.6 g/dL (ref 1.5–4.5)
Potassium: 4.7 mmol/L (ref 3.5–5.2)
Sodium: 139 mmol/L (ref 134–144)
Total Protein: 7.3 g/dL (ref 6.0–8.5)

## 2016-09-03 LAB — LIPID PANEL W/O CHOL/HDL RATIO
Cholesterol, Total: 149 mg/dL (ref 100–199)
HDL: 55 mg/dL (ref >39)
LDL CALC: 71 mg/dL (ref 0–99)
Triglycerides: 114 mg/dL (ref 0–149)
VLDL CHOLESTEROL CAL: 23 mg/dL (ref 5–40)

## 2016-09-03 LAB — CBC WITH DIFFERENTIAL/PLATELET
BASOS ABS: 0 10*3/uL (ref 0.0–0.2)
Basos: 0 %
EOS (ABSOLUTE): 0.1 10*3/uL (ref 0.0–0.4)
EOS: 2 %
HEMOGLOBIN: 12.5 g/dL (ref 11.1–15.9)
Hematocrit: 37.7 % (ref 34.0–46.6)
IMMATURE GRANS (ABS): 0 10*3/uL (ref 0.0–0.1)
Immature Granulocytes: 0 %
LYMPHS ABS: 2.2 10*3/uL (ref 0.7–3.1)
Lymphs: 48 %
MCH: 31 pg (ref 26.6–33.0)
MCHC: 33.2 g/dL (ref 31.5–35.7)
MCV: 94 fL (ref 79–97)
MONOCYTES: 14 %
Monocytes Absolute: 0.6 10*3/uL (ref 0.1–0.9)
NEUTROS ABS: 1.7 10*3/uL (ref 1.4–7.0)
NEUTROS PCT: 36 %
PLATELETS: 251 10*3/uL (ref 150–379)
RBC: 4.03 x10E6/uL (ref 3.77–5.28)
RDW: 14.8 % (ref 12.3–15.4)
WBC: 4.6 10*3/uL (ref 3.4–10.8)

## 2016-09-03 LAB — TSH: TSH: 2.41 u[IU]/mL (ref 0.450–4.500)

## 2017-02-06 DIAGNOSIS — Z471 Aftercare following joint replacement surgery: Secondary | ICD-10-CM | POA: Diagnosis not present

## 2017-02-06 DIAGNOSIS — G5601 Carpal tunnel syndrome, right upper limb: Secondary | ICD-10-CM | POA: Diagnosis not present

## 2017-02-06 DIAGNOSIS — Z96612 Presence of left artificial shoulder joint: Secondary | ICD-10-CM | POA: Diagnosis not present

## 2017-02-06 DIAGNOSIS — M87022 Idiopathic aseptic necrosis of left humerus: Secondary | ICD-10-CM | POA: Diagnosis not present

## 2017-07-15 DIAGNOSIS — Z23 Encounter for immunization: Secondary | ICD-10-CM | POA: Diagnosis not present

## 2017-10-04 ENCOUNTER — Ambulatory Visit: Payer: Medicare Other

## 2017-10-06 ENCOUNTER — Encounter: Payer: Medicare Other | Admitting: Unknown Physician Specialty

## 2017-10-18 ENCOUNTER — Ambulatory Visit (INDEPENDENT_AMBULATORY_CARE_PROVIDER_SITE_OTHER): Payer: Medicare Other

## 2017-10-18 ENCOUNTER — Other Ambulatory Visit: Payer: Self-pay | Admitting: Family Medicine

## 2017-10-18 VITALS — BP 124/72 | HR 64 | Temp 97.4°F | Resp 16 | Ht 68.0 in | Wt 240.5 lb

## 2017-10-18 DIAGNOSIS — Z0000 Encounter for general adult medical examination without abnormal findings: Secondary | ICD-10-CM | POA: Diagnosis not present

## 2017-10-18 DIAGNOSIS — Z1239 Encounter for other screening for malignant neoplasm of breast: Secondary | ICD-10-CM

## 2017-10-18 DIAGNOSIS — Z1231 Encounter for screening mammogram for malignant neoplasm of breast: Secondary | ICD-10-CM | POA: Diagnosis not present

## 2017-10-18 DIAGNOSIS — Z1159 Encounter for screening for other viral diseases: Secondary | ICD-10-CM

## 2017-10-18 MED ORDER — SIMVASTATIN 40 MG PO TABS: 40.0000 mg | ORAL_TABLET | Freq: Every day | ORAL | 0 refills | Status: DC

## 2017-10-18 MED ORDER — LEVOTHYROXINE SODIUM 50 MCG PO TABS: 50.0000 ug | ORAL_TABLET | Freq: Every day | ORAL | 0 refills | Status: DC

## 2017-10-18 NOTE — Patient Instructions (Addendum)
Wanda White , Thank you for taking time to come for your Medicare Wellness Visit. I appreciate your ongoing commitment to your health goals. Please review the following plan we discussed and let me know if I can assist you in the future.   Screening recommendations/referrals: Colonoscopy: completed 03/05/2012 Mammogram: due now. Please call [phone] to schedule your mammogram.  Bone Density: completed 12/24/2014 Recommended yearly ophthalmology/optometry visit for glaucoma screening and checkup Recommended yearly dental visit for hygiene and checkup  Vaccinations: Influenza vaccine: up to date  Pneumococcal vaccine: up to date Tdap vaccine: up to date  Shingles vaccine: due now, check with your insurance company for coverage  Advanced directives: Advance directive discussed with you today. Even though you declined this today please call our office should you change your mind and we can give you the proper paperwork for you to fill out.  Conditions/risks identified: Recommend drinking at last 6-8 glasses of water a day   Next appointment: Follow up on 11/06/2017 at 10:00am with Dr.Crissman. Follow up in one year for your annual wellness exam.   Preventive Care 65 Years and Older, Female Preventive care refers to lifestyle choices and visits with your health care provider that can promote health and wellness. What does preventive care include?  A yearly physical exam. This is also called an annual well check.  Dental exams once or twice a year.  Routine eye exams. Ask your health care provider how often you should have your eyes checked.  Personal lifestyle choices, including:  Daily care of your teeth and gums.  Regular physical activity.  Eating a healthy diet.  Avoiding tobacco and drug use.  Limiting alcohol use.  Practicing safe sex.  Taking low-dose aspirin every day.  Taking vitamin and mineral supplements as recommended by your health care provider. What  happens during an annual well check? The services and screenings done by your health care provider during your annual well check will depend on your age, overall health, lifestyle risk factors, and family history of disease. Counseling  Your health care provider may ask you questions about your:  Alcohol use.  Tobacco use.  Drug use.  Emotional well-being.  Home and relationship well-being.  Sexual activity.  Eating habits.  History of falls.  Memory and ability to understand (cognition).  Work and work Astronomerenvironment.  Reproductive health. Screening  You may have the following tests or measurements:  Height, weight, and BMI.  Blood pressure.  Lipid and cholesterol levels. These may be checked every 5 years, or more frequently if you are over 70 years old.  Skin check.  Lung cancer screening. You may have this screening every year starting at age 70 if you have a 30-pack-year history of smoking and currently smoke or have quit within the past 15 years.  Fecal occult blood test (FOBT) of the stool. You may have this test every year starting at age 70.  Flexible sigmoidoscopy or colonoscopy. You may have a sigmoidoscopy every 5 years or a colonoscopy every 10 years starting at age 70.  Hepatitis C blood test.  Hepatitis B blood test.  Sexually transmitted disease (STD) testing.  Diabetes screening. This is done by checking your blood sugar (glucose) after you have not eaten for a while (fasting). You may have this done every 1-3 years.  Bone density scan. This is done to screen for osteoporosis. You may have this done starting at age 70.  Mammogram. This may be done every 1-2 years. Talk to your health  care provider about how often you should have regular mammograms. Talk with your health care provider about your test results, treatment options, and if necessary, the need for more tests. Vaccines  Your health care provider may recommend certain vaccines, such  as:  Influenza vaccine. This is recommended every year.  Tetanus, diphtheria, and acellular pertussis (Tdap, Td) vaccine. You may need a Td booster every 10 years.  Zoster vaccine. You may need this after age 57.  Pneumococcal 13-valent conjugate (PCV13) vaccine. One dose is recommended after age 44.  Pneumococcal polysaccharide (PPSV23) vaccine. One dose is recommended after age 14. Talk to your health care provider about which screenings and vaccines you need and how often you need them. This information is not intended to replace advice given to you by your health care provider. Make sure you discuss any questions you have with your health care provider. Document Released: 10/09/2015 Document Revised: 06/01/2016 Document Reviewed: 07/14/2015 Elsevier Interactive Patient Education  2017 [place] Prevention in the Home Falls can cause injuries. They can happen to people of all ages. There are many things you can do to make your home safe and to help prevent falls. What can I do on the outside of my home?  Regularly fix the edges of walkways and driveways and fix any cracks.  Remove anything that might make you trip as you walk through a door, such as a raised step or threshold.  Trim any bushes or trees on the path to your home.  Use bright outdoor lighting.  Clear any walking paths of anything that might make someone trip, such as rocks or tools.  Regularly check to see if handrails are loose or broken. Make sure that both sides of any steps have handrails.  Any raised decks and porches should have guardrails on the edges.  Have any leaves, snow, or ice cleared regularly.  Use sand or salt on walking paths during winter.  Clean up any spills in your garage right away. This includes oil or grease spills. What can I do in the bathroom?  Use night lights.  Install grab bars by the toilet and in the tub and shower. Do not use towel bars as grab bars.  Use  non-skid mats or decals in the tub or shower.  If you need to sit down in the shower, use a plastic, non-slip stool.  Keep the floor dry. Clean up any water that spills on the floor as soon as it happens.  Remove soap buildup in the tub or shower regularly.  Attach bath mats securely with double-sided non-slip rug tape.  Do not have throw rugs and other things on the floor that can make you trip. What can I do in the bedroom?  Use night lights.  Make sure that you have a light by your bed that is easy to reach.  Do not use any sheets or blankets that are too big for your bed. They should not hang down onto the floor.  Have a firm chair that has side arms. You can use this for support while you get dressed.  Do not have throw rugs and other things on the floor that can make you trip. What can I do in the kitchen?  Clean up any spills right away.  Avoid walking on wet floors.  Keep items that you use a lot in easy-to-reach places.  If you need to reach something above you, use a strong step stool that has a grab  bar.  Keep electrical cords out of the way.  Do not use floor polish or wax that makes floors slippery. If you must use wax, use non-skid floor wax.  Do not have throw rugs and other things on the floor that can make you trip. What can I do with my stairs?  Do not leave any items on the stairs.  Make sure that there are handrails on both sides of the stairs and use them. Fix handrails that are broken or loose. Make sure that handrails are as long as the stairways.  Check any carpeting to make sure that it is firmly attached to the stairs. Fix any carpet that is loose or worn.  Avoid having throw rugs at the top or bottom of the stairs. If you do have throw rugs, attach them to the floor with carpet tape.  Make sure that you have a light switch at the top of the stairs and the bottom of the stairs. If you do not have them, ask someone to add them for you. What  else can I do to help prevent falls?  Wear shoes that:  Do not have high heels.  Have rubber bottoms.  Are comfortable and fit you well.  Are closed at the toe. Do not wear sandals.  If you use a stepladder:  Make sure that it is fully opened. Do not climb a closed stepladder.  Make sure that both sides of the stepladder are locked into place.  Ask someone to hold it for you, if possible.  Clearly mark and make sure that you can see:  Any grab bars or handrails.  First and last steps.  Where the edge of each step is.  Use tools that help you move around (mobility aids) if they are needed. These include:  Canes.  Walkers.  Scooters.  Crutches.  Turn on the lights when you go into a dark area. Replace any light bulbs as soon as they burn out.  Set up your furniture so you have a clear path. Avoid moving your furniture around.  If any of your floors are uneven, fix them.  If there are any pets around you, be aware of where they are.  Review your medicines with your doctor. Some medicines can make you feel dizzy. This can increase your chance of falling. Ask your doctor what other things that you can do to help prevent falls. This information is not intended to replace advice given to you by your health care provider. Make sure you discuss any questions you have with your health care provider. Document Released: 07/09/2009 Document Revised: 02/18/2016 Document Reviewed: 10/17/2014 Elsevier Interactive Patient Education  2017 [employer].

## 2017-10-18 NOTE — Progress Notes (Signed)
Subjective:   Wanda White is a 72 y.o. female who presents for Medicare Annual (Subsequent) preventive examination.  Review of Systems:   Cardiac Risk Factors include: advanced age (>47men, >58 women);dyslipidemia;obesity (BMI >30kg/m2)     Objective:     Vitals: BP 124/72 (BP Location: Left Arm, Patient Position: Sitting)   Pulse 64   Temp (!) 97.4 F (36.3 C) (Temporal)   Resp 16   Ht 5\' 8"  (1.727 m)   Wt 240 lb 8 oz (109.1 kg)   LMP  (LMP Unknown)   BMI 36.57 kg/m   Body mass index is 36.57 kg/m.  Advanced Directives 10/18/2017 09/02/2016  Does Patient Have a Medical Advance Directive? No No  Would patient like information on creating a medical advance directive? No - Patient declined -    Tobacco Social History   Tobacco Use  Smoking Status Former Smoker  . Years: 20.00  . Last attempt to quit: 09/26/2010  . Years since quitting: 7.0  Smokeless Tobacco Never Used     Counseling given: Not Answered   Clinical Intake:  Pre-visit preparation completed: Yes  Pain : 0-10 Pain Score: 0-No pain Pain Type: Acute pain Pain Location: Shoulder Pain Orientation: Right Pain Descriptors / Indicators: Aching, Throbbing Pain Onset: More than a month ago Pain Frequency: Intermittent Pain Relieving Factors: aleve   Pain Relieving Factors: aleve   Nutritional Status: BMI > 30  Obese Nutritional Risks: None Diabetes: No  How often do you need to have someone help you when you read instructions, pamphlets, or other written materials from your doctor or pharmacy?: 1 - Never What is the last grade level you completed in school?: high school graduate      Information entered by :: Tiffany Hill,LPN   Past Medical History:  Diagnosis Date  . Depression   . Hyperlipidemia   . Hypothyroid    Past Surgical History:  Procedure Laterality Date  . CATARACT EXTRACTION  2004  . JOINT REPLACEMENT  01/2016   left shoulder  . SHOULDER SURGERY    . THYROID SURGERY       gland removed  . TUBAL LIGATION     Family History  Problem Relation Age of Onset  . Cancer Mother 47       breast  . Thyroid disease Mother   . CAD Father   . Hypertension Father   . Heart attack Father   . Heart attack Maternal Grandfather   . Heart disease Paternal Grandmother   . Lung disease Paternal Grandfather   . Cancer Sister        breast  . Hypertension Sister    Social History   Socioeconomic History  . Marital status: Widowed    Spouse name: None  . Number of children: None  . Years of education: None  . Highest education level: None  Social Needs  . Financial resource strain: Not hard at all  . Food insecurity - worry: Never true  . Food insecurity - inability: None  . Transportation needs - medical: No  . Transportation needs - non-medical: No  Occupational History  . None  Tobacco Use  . Smoking status: Former Smoker    Years: 20.00    Last attempt to quit: 09/26/2010    Years since quitting: 7.0  . Smokeless tobacco: Never Used  Substance and Sexual Activity  . Alcohol use: Yes    Comment: occasionally/socially  . Drug use: No  . Sexual activity: No  Other  Topics Concern  . None  Social History Narrative  . None    Outpatient Encounter Medications as of 10/18/2017  Medication Sig  . aspirin EC 81 MG tablet Take 81 mg by mouth daily.  . Calcium Carb-Cholecalciferol (CALCIUM + D3) 600-200 MG-UNIT TABS Take 600 mg by mouth 2 (two) times daily. Reported on 01/19/2016  . Cholecalciferol (VITAMIN D-3 PO) Take 1,000 Units by mouth daily. Reported on 01/19/2016  . levothyroxine (SYNTHROID, LEVOTHROID) 50 MCG tablet Take 1 tablet (50 mcg total) by mouth daily.  . Magnesium 250 MG TABS Take 250 mg by mouth daily. Reported on 01/19/2016  . Melatonin 1 MG TABS Take 1 mg by mouth daily as needed.  . Multiple Vitamin (MULTIVITAMIN) tablet Take 1 tablet by mouth daily. Reported on 01/19/2016  . Omega-3 Fatty Acids (FISH OIL) 1000 MG CAPS Take 1,000 mg by  mouth 2 (two) times daily. Reported on 01/19/2016  . simvastatin (ZOCOR) 40 MG tablet Take 1 tablet (40 mg total) by mouth at bedtime.  . vitamin C (ASCORBIC ACID) 500 MG tablet Take 500 mg by mouth daily. Reported on 01/19/2016  . vitamin E 400 UNIT capsule Take 400 Units by mouth daily. Reported on 01/19/2016   No facility-administered encounter medications on file as of 10/18/2017.     Activities of Daily Living In your present state of health, do you have any difficulty performing the following activities: 10/18/2017  Hearing? N  Vision? N  Difficulty concentrating or making decisions? N  Walking or climbing stairs? N  Dressing or bathing? N  Doing errands, shopping? N  Preparing Food and eating ? N  Using the Toilet? N  In the past six months, have you accidently leaked urine? N  Do you have problems with loss of bowel control? N  Managing your Medications? N  Managing your Finances? N  Housekeeping or managing your Housekeeping? N  Some recent data might be hidden    Patient Care Team: Gabriel Cirri, NP as PCP - General (Nurse Practitioner) Leroy Sea, NP (Sports Medicine) Wendee Copp, MD as Referring Physician (Orthopedic Surgery)    Assessment:   This is a routine wellness examination for [name].  Exercise Activities and Dietary recommendations Current Exercise Habits: The patient does not participate in regular exercise at present, Exercise limited by: None identified  Goals    . DIET - INCREASE WATER INTAKE     Recommend drinking at last 6-8 glasses of water a day        Fall Risk Fall Risk  10/18/2017 09/02/2016 01/19/2016 09/18/2015  Falls in the past year? No No No No   Is the patient's home free of loose throw rugs in walkways, pet beds, electrical cords, etc?   yes      Grab bars in the bathroom? yes      Handrails on the stairs?   yes      Adequate lighting?   yes  Timed Get Up and Go performed: completed in 8 seconds with no use of  assistive devices. Steady gait. No intervention needed at this time.   Depression Screen PHQ 2/9 Scores 10/18/2017 09/02/2016 01/19/2016  PHQ - 2 Score 0 0 0  PHQ- 9 Score 1 - -     Cognitive Function     6CIT Screen 10/18/2017  What Year? 0 points  What month? 0 points  What time? 0 points  Count back from 20 0 points  Months in reverse 0 points  Repeat phrase 0  points  Total Score 0    Immunization History  Administered Date(s) Administered  . Influenza-Unspecified 11/21/2014, 08/22/2016  . Pneumococcal Conjugate-13 03/18/2014  . Pneumococcal Polysaccharide-23 04/02/2013  . Td 02/10/2004    Qualifies for Shingles Vaccine? Discussed shingrix vaccine.   Screening Tests Health Maintenance  Topic Date Due  . Hepatitis C Screening  25-Oct-1945  . MAMMOGRAM  03/14/2014  . TETANUS/TDAP  07/27/2021  . COLONOSCOPY  03/05/2022  . INFLUENZA VACCINE  Completed  . DEXA SCAN  Completed  . PNA vac Low Risk Adult  Completed    Cancer Screenings: Lung: Low Dose CT Chest recommended if Age 86-80 years, 30 pack-year currently smoking OR have quit w/in 15years. Patient does qualify.- declined Breast:  Up to date on Mammogram? No ordered Up to date of Bone Density/Dexa? Yes Colorectal: completed 03/05/2012  Additional Screenings:  Hepatitis B/HIV/Syphillis: not indicated Hepatitis C Screening: due now, will order for next lab draw     Plan:    I have personally reviewed and addressed the Medicare Annual Wellness questionnaire and have noted the following in the patient's chart:  A. Medical and social history B. Use of alcohol, tobacco or illicit drugs  C. Current medications and supplements D. Functional ability and status E.  Nutritional status F.  Physical activity G. Advance directives H. List of other physicians I.  Hospitalizations, surgeries, and ER visits in previous 12 months J.  Vitals K. Screenings such as hearing and vision if needed, cognitive and  depression L. Referrals and appointments   In addition, I have reviewed and discussed with patient certain preventive protocols, quality metrics, and best practice recommendations. A written personalized care plan for preventive services as well as general preventive health recommendations were provided to patient.   Signed,  Marin Robertsiffany Hill, LPN Nurse Health Advisor   Nurse Notes: patient requesting synthroid and simvistatin, Dr.Johnson agreed to fill a 30 day supply to get patient by until appointment on 11/06/2017

## 2017-11-06 ENCOUNTER — Encounter: Payer: Self-pay | Admitting: Unknown Physician Specialty

## 2017-11-06 ENCOUNTER — Ambulatory Visit (INDEPENDENT_AMBULATORY_CARE_PROVIDER_SITE_OTHER): Payer: Medicare Other | Admitting: Unknown Physician Specialty

## 2017-11-06 ENCOUNTER — Telehealth: Payer: Self-pay | Admitting: Unknown Physician Specialty

## 2017-11-06 DIAGNOSIS — M67911 Unspecified disorder of synovium and tendon, right shoulder: Secondary | ICD-10-CM | POA: Insufficient documentation

## 2017-11-06 DIAGNOSIS — E039 Hypothyroidism, unspecified: Secondary | ICD-10-CM

## 2017-11-06 DIAGNOSIS — E782 Mixed hyperlipidemia: Secondary | ICD-10-CM

## 2017-11-06 MED ORDER — LEVOTHYROXINE SODIUM 50 MCG PO TABS: 50.0000 ug | ORAL_TABLET | Freq: Every day | ORAL | 3 refills | Status: DC

## 2017-11-06 MED ORDER — LEVOTHYROXINE SODIUM 50 MCG PO TABS: 50.0000 ug | ORAL_TABLET | Freq: Every day | ORAL | 0 refills | Status: DC

## 2017-11-06 MED ORDER — SIMVASTATIN 40 MG PO TABS: 40.0000 mg | ORAL_TABLET | Freq: Every day | ORAL | 0 refills | Status: DC

## 2017-11-06 MED ORDER — SIMVASTATIN 40 MG PO TABS: 40.0000 mg | ORAL_TABLET | Freq: Every day | ORAL | 3 refills | Status: DC

## 2017-11-06 NOTE — Assessment & Plan Note (Signed)
Shoulder x-ray.  Refer to PT.  Pt ed

## 2017-11-06 NOTE — Progress Notes (Signed)
BP 125/80   Pulse 84   Temp 98.3 F (36.8 C) (Oral)   Ht 5\' 8"  (1.727 m)   Wt 241 lb 3.2 oz (109.4 kg)   LMP  (LMP Unknown)   SpO2 100%   BMI 36.67 kg/m    Subjective:    Patient ID: Wanda White, Wanda White    DOB: 10-27-1945, 72 y.o.   MRN: [id number]  HPI: LAKEISA HENINGER is a 72 y.o. Wanda White  Chief Complaint  Patient presents with  . Annual Exam    pt had wellness exam 10/18/17 with [facility], has mammogram scheduled in March    Hypothyroid No changes in energy or weight.    Hyperlipidemia Using medications without problems: No Muscle aches  Diet compliance:  Exercise: Sedentary.  Tried [employer] but hip ached following   Relevant past medical, surgical, family and social history reviewed and updated as indicated. Interim medical history since our last visit reviewed. Allergies and medications reviewed and updated.  Review of Systems  Constitutional: Negative.   HENT: Negative.   Respiratory: Negative.   Cardiovascular: Negative.   Genitourinary: Negative.   Musculoskeletal:       Rebuilt left shoulder.  Right shoulder pain.  Right hip pain  Skin: Negative.   Neurological: Negative.   Psychiatric/Behavioral: Negative.     Per HPI unless specifically indicated above     Objective:    BP 125/80   Pulse 84   Temp 98.3 F (36.8 C) (Oral)   Ht 5\' 8"  (1.727 m)   Wt 241 lb 3.2 oz (109.4 kg)   LMP  (LMP Unknown)   SpO2 100%   BMI 36.67 kg/m   Wt Readings from Last 3 Encounters:  11/06/17 241 lb 3.2 oz (109.4 kg)  10/18/17 240 lb 8 oz (109.1 kg)  09/02/16 228 lb (103.4 kg)    Physical Exam  Constitutional: She is oriented to person, place, and time. She appears well-developed and well-nourished.  HENT:  Head: Normocephalic and atraumatic.  Eyes: Pupils are equal, round, and reactive to light. Right eye exhibits no discharge. Left eye exhibits no discharge. No scleral icterus.  Neck: Normal range of motion. Neck supple. Carotid bruit is not present. No  thyromegaly present.  Cardiovascular: Normal rate, regular rhythm and normal heart sounds. Exam reveals no gallop and no friction rub.  No murmur heard. Pulmonary/Chest: Effort normal and breath sounds normal. No respiratory distress. She has no wheezes. She has no rales.  Abdominal: Soft. Bowel sounds are normal. There is no tenderness. There is no rebound.  Genitourinary: No breast swelling, tenderness or discharge.  Musculoskeletal: Normal range of motion.  Lymphadenopathy:    She has no cervical adenopathy.  Neurological: She is alert and oriented to person, place, and time.  Skin: Skin is warm, dry and intact. No rash noted.  Psychiatric: She has a normal mood and affect. Her speech is normal and behavior is normal. Judgment and thought content normal. Cognition and memory are normal.    Results for orders placed or performed in visit on 09/02/16  HM COLONOSCOPY  Result Value Ref Range   [facility] Colonoscopy Patient Reported See Report (in chart), Patient Reported      Assessment & Plan:   Problem List Items Addressed This Visit      Unprioritized   Disorder of rotator cuff, right    Shoulder x-ray.  Refer to PT.  Pt ed      Hyperlipidemia    Check lipid panel  Relevant Orders   Lipid Panel w/o Chol/HDL Ratio   Hypothyroid    Check thyroid panel      Relevant Orders   TSH      Health maintenance:   Mammogram Scheduled Td due 2020 Colonscopy due 2023  Follow up plan: Return in about 1 year (around 11/06/2018).

## 2017-11-06 NOTE — Assessment & Plan Note (Signed)
Check thyroid panel 

## 2017-11-06 NOTE — Patient Instructions (Addendum)
Rotator Cuff Tendinitis Rotator cuff tendinitis is inflammation of the tough, cord-like bands that connect muscle to bone (tendons) in the rotator cuff. The rotator cuff includes all of the muscles and tendons that connect the arm to the shoulder. The rotator cuff holds the head of the upper arm bone (humerus) in the cup (fossa) of the shoulder blade (scapula). This condition can lead to a long-lasting (chronic) tear. The tear may be partial or complete. What are the causes? This condition is usually caused by overusing the rotator cuff. What increases the risk? This condition is more likely to develop in athletes and workers who frequently use their shoulder or reach over their heads. This can include activities such as:  Tennis.  Baseball or softball.  Swimming.  Construction work.  Painting.  What are the signs or symptoms? Symptoms of this condition include:  Pain spreading (radiating) from the shoulder to the upper arm.  Swelling and tenderness in front of the shoulder.  Pain when reaching, pulling, or lifting the arm above the head.  Pain when lowering the arm from above the head.  Minor pain in the shoulder when resting.  Increased pain in the shoulder at night.  Difficulty placing the arm behind the back.  How is this diagnosed? This condition is diagnosed with a medical history and physical exam. Tests may also be done, including:  X-rays.  MRI.  Ultrasounds.  CT or MR arthrogram. During this test, a contrast material is injected and then images are taken.  How is this treated? Treatment for this condition depends on the severity of the condition. In less severe cases, treatment may include:  Rest. This may be done with a sling that holds the shoulder still (immobilization). Your health care provider may also recommend avoiding activities that involve lifting your arm over your head.  Icing the shoulder.  Anti-inflammatory medicines, such as aspirin or  ibuprofen.  In more severe cases, treatment may include:  Physical therapy.  Steroid injections.  Surgery.  Follow these instructions at home: If you have a sling:  Wear the sling as told by your health care provider. Remove it only as told by your health care provider.  Loosen the sling if your fingers tingle, become numb, or turn cold and blue.  Keep the sling clean.  If the sling is not waterproof, do not let it get wet. Remove it, if allowed, or cover it with a watertight covering when you take a bath or shower. Managing pain, stiffness, and swelling  If directed, put ice on the injured area. ? If you have a removable sling, remove it as told by your health care provider. ? Put ice in a plastic bag. ? Place a towel between your skin and the bag. ? Leave the ice on for 20 minutes, 2-3 times a day.  Move your fingers often to avoid stiffness and to lessen swelling.  Raise (elevate) the injured area above the level of your heart while you are lying down.  Find a comfortable sleeping position or sleep on a recliner, if available. Driving  Do not drive or use heavy machinery while taking prescription pain medicine.  Ask your health care provider when it is safe to drive if you have a sling on your arm. Activity  Rest your shoulder as told by your health care provider.  Return to your normal activities as told by your health care provider. Ask your health care provider what activities are safe for you.  Do any   exercises or stretches as told by your health care provider.  If you do repetitive overhead tasks, take small breaks in between and include stretching exercises as told by your health care provider. General instructions  Do not use any products that contain nicotine or tobacco, such as cigarettes and e-cigarettes. These can delay healing. If you need help quitting, ask your health care provider.  Take over-the-counter and prescription medicines only as told by  your health care provider.  Keep all follow-up visits as told by your health care provider. This is important. Contact a health care provider if:  Your pain gets worse.  You have new pain in your arm, hands, or fingers.  Your pain is not relieved with medicine or does not get better after 6 weeks of treatment.  You have cracking sensations when moving your shoulder in certain directions.  You hear a snapping sound after using your shoulder, followed by severe pain and weakness. Get help right away if:  Your arm, hand, or fingers are numb or tingling.  Your arm, hand, or fingers are swollen or painful or they turn white or blue. Summary  Rotator cuff tendinitis is inflammation of the tough, cord-like bands that connect muscle to bone (tendons) in the rotator cuff.  This condition is usually caused by overusing the rotator cuff, which includes all of the muscles and tendons that connect the arm to the shoulder.  This condition is more likely to develop in athletes and workers who frequently use their shoulder or reach over their heads.  Treatment generally includes rest, anti-inflammatory medicines, and icing. In some cases, physical therapy and steroid injections may be needed. In severe cases, surgery may be needed. This information is not intended to replace advice given to you by your health care provider. Make sure you discuss any questions you have with your health care provider. Document Released: 12/03/2003 Document Revised: 08/29/2016 Document Reviewed: 08/29/2016 Elsevier Interactive Patient Education  2017 [employer]  Shoulder Impingement Syndrome Shoulder impingement syndrome is a condition that causes pain when connective tissues (tendons) surrounding the shoulder joint become pinched. These tendons are part of the group of muscles and tissues that help to stabilize the shoulder (rotator cuff). Beneath the rotator cuff is a fluid-filled sac (bursa) that allows the  muscles and tendons to glide smoothly. The bursa may become swollen or irritated (bursitis). Bursitis, swelling in the rotator cuff tendons, or both conditions can decrease how much space is under a bone in the shoulder joint (acromion), resulting in impingement. What are the causes? Shoulder impingement syndrome can be caused by bursitis or swelling of the rotator cuff tendons, which may result from:  Repetitive overhead arm movements.  Falling onto the shoulder.  Weakness in the shoulder muscles.  What increases the risk? You may be more likely to develop this condition if you are an athlete who participates in:  Sports that involve throwing, such as baseball.  Tennis.  Swimming.  Volleyball.  Some people are also more likely to develop impingement syndrome because of the shape of their acromion bone. What are the signs or symptoms? The main symptom of this condition is pain on the front or side of the shoulder. Pain may:  Get worse when lifting or raising the arm.  Get worse at night.  Wake you up from sleeping.  Feel sharp when the shoulder is moved, and then fade to an ache.  Other signs and symptoms may include:  Tenderness.  Stiffness.  Inability to raise  the arm above shoulder level or behind the body.  Weakness.  How is this diagnosed? This condition may be diagnosed based on:  Your symptoms.  Your medical history.  A physical exam.  Imaging tests, such as: ? X-rays. ? MRI. ? Ultrasound.  How is this treated? Treatment for this condition may include:  Resting your shoulder and avoiding all activities that cause pain or put stress on the shoulder.  Icing your shoulder.  NSAIDs to help reduce pain and swelling.  One or more injections of medicines to numb the area and reduce inflammation.  Physical therapy.  Surgery. This may be needed if nonsurgical treatments have not helped. Surgery may involve repairing the rotator cuff, reshaping the  acromion, or removing the bursa.  Follow these instructions at home: Managing pain, stiffness, and swelling  If directed, apply ice to the injured area. ? Put ice in a plastic bag. ? Place a towel between your skin and the bag. ? Leave the ice on for 20 minutes, 2-3 times a day. Activity  Rest and return to your normal activities as told by your health care provider. Ask your health care provider what activities are safe for you.  Do exercises as told by your health care provider. General instructions  Do not use any tobacco products, including cigarettes, chewing tobacco, or e-cigarettes. Tobacco can delay healing. If you need help quitting, ask your health care provider.  Ask your health care provider when it is safe for you to drive.  Take over-the-counter and prescription medicines only as told by your health care provider.  Keep all follow-up visits as told by your health care provider. This is important. How is this prevented?  Give your body time to rest between periods of activity.  Be safe and responsible while being active to avoid falls.  Maintain physical fitness, including strength and flexibility. Contact a health care provider if:  Your symptoms have not improved after 1-2 months of treatment and rest.  You cannot lift your arm away from your body. This information is not intended to replace advice given to you by your health care provider. Make sure you discuss any questions you have with your health care provider. Document Released: 09/12/2005 Document Revised: 05/19/2016 Document Reviewed: 08/15/2015 Elsevier Interactive Patient Education  [employer].

## 2017-11-06 NOTE — Assessment & Plan Note (Signed)
Check lipid panel  

## 2017-11-06 NOTE — Assessment & Plan Note (Signed)
Stable, continue present medications.   

## 2017-11-06 NOTE — Telephone Encounter (Signed)
Pt would like to request refill for synthroid and simvastatin sent to [facility].

## 2017-11-07 LAB — LIPID PANEL W/O CHOL/HDL RATIO
Cholesterol, Total: 179 mg/dL (ref 100–199)
HDL: 55 mg/dL (ref >39)
LDL Calculated: 98 mg/dL (ref 0–99)
Triglycerides: 129 mg/dL (ref 0–149)
VLDL Cholesterol Cal: 26 mg/dL (ref 5–40)

## 2017-11-07 LAB — TSH: TSH: 2.81 u[IU]/mL (ref 0.450–4.500)

## 2017-11-19 ENCOUNTER — Ambulatory Visit
Admission: EM | Admit: 2017-11-19 | Discharge: 2017-11-19 | Disposition: A | Discharge status: Home / Self Care | Payer: Medicare Other | Attending: Family Medicine | Admitting: Family Medicine

## 2017-11-19 ENCOUNTER — Encounter: Payer: Self-pay | Admitting: Emergency Medicine

## 2017-11-19 ENCOUNTER — Other Ambulatory Visit: Payer: Self-pay

## 2017-11-19 DIAGNOSIS — J0100 Acute maxillary sinusitis, unspecified: Secondary | ICD-10-CM | POA: Diagnosis not present

## 2017-11-19 DIAGNOSIS — R05 Cough: Secondary | ICD-10-CM | POA: Diagnosis not present

## 2017-11-19 DIAGNOSIS — R059 Cough, unspecified: Secondary | ICD-10-CM

## 2017-11-19 MED ORDER — DOXYCYCLINE HYCLATE 100 MG PO CAPS: 100.0000 mg | ORAL_CAPSULE | Freq: Two times a day (BID) | ORAL | 0 refills | Status: DC

## 2017-11-19 MED ORDER — FLUTICASONE PROPIONATE 50 MCG/ACT NA SUSP: 2.0000 | Freq: Every day | NASAL | 2 refills | Status: DC

## 2017-11-19 NOTE — ED Triage Notes (Signed)
Patient c/o runny nose, cough, congestion, and sinus pressure for the past week.

## 2017-11-19 NOTE — Discharge Instructions (Signed)
Please use Flonase as prescribed.  Continue with over-the-counter decongestant.  Continue with Tessalon Perles.  Please start doxycycline for sinus infection.  Follow-up with PCP if no improvement 1 week.

## 2017-11-19 NOTE — ED Provider Notes (Signed)
[facility]    CSN: [id number] Arrival date & time: 11/19/17  0830     History   Chief Complaint Chief Complaint  Patient presents with  . Cough    HPI Wanda White is a 72 y.o. female.   Presents to the urgent care facility for evaluation of severe sinus pain and pressure that has been present since 11/10/2017.  Pain is located over the maxillary and frontal sinus.  She describes pressure that is severe with leaning forward.  She is also had a lot of drainage causing her to cough.  She is been using [employer] with good relief.  She is also been taking Tylenol Cold with mild improvement.  She denies any nausea vomiting diarrhea.  No fevers.  Cough is nonproductive.  HPI  Past Medical History:  Diagnosis Date  . Depression   . Hyperlipidemia   . Hypothyroid     Patient Active Problem List   Diagnosis Date Noted  . Disorder of rotator cuff, right 11/06/2017  . 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 Hypertension 01/19/2016  . 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 Hyperlipidemia   . Hypothyroid   . Anxiety   . Depression   . Asthma     Past Surgical History:  Procedure Laterality Date  . CATARACT EXTRACTION  2004  . JOINT REPLACEMENT  01/2016   left shoulder  . SHOULDER SURGERY    . THYROID SURGERY     gland removed  . TUBAL LIGATION      OB History    No data available       Home Medications    Prior to Admission medications   Medication Sig Start Date End Date Taking? Authorizing Provider  aspirin EC 81 MG tablet Take 81 mg by mouth daily.   Yes [provider]  Calcium Carb-Cholecalciferol (CALCIUM + D3) 600-200 MG-UNIT TABS Take 600 mg by mouth 2 (two) times daily. Reported on 01/19/2016   Yes [provider]  Cholecalciferol (VITAMIN D-3 PO) Take 1,000 Units by mouth daily. Reported on 01/19/2016   Yes [provider]  levothyroxine (SYNTHROID, LEVOTHROID) 50 MCG tablet  Take 1 tablet (50 mcg total) by mouth daily. 11/06/17  Yes Gabriel Cirri, NP  Magnesium 250 MG TABS Take 250 mg by mouth daily. Reported on 01/19/2016   Yes [provider]  Melatonin 1 MG TABS Take 1 mg by mouth daily as needed.   Yes [provider]  Multiple Vitamin (MULTIVITAMIN) tablet Take 1 tablet by mouth daily. Reported on 01/19/2016   Yes [provider]  Omega-3 Fatty Acids (FISH OIL) 1000 MG CAPS Take 1,000 mg by mouth 2 (two) times daily. Reported on 01/19/2016   Yes [provider]  simvastatin (ZOCOR) 40 MG tablet Take 1 tablet (40 mg total) by mouth at bedtime. 11/06/17  Yes Gabriel Cirri, NP  vitamin C (ASCORBIC ACID) 500 MG tablet Take 500 mg by mouth daily. Reported on 01/19/2016   Yes [provider]  vitamin E 400 UNIT capsule Take 400 Units by mouth daily. Reported on 01/19/2016   Yes [provider]  doxycycline (VIBRAMYCIN) 100 MG capsule Take 1 capsule (100 mg total) by mouth 2 (two) times daily. 11/19/17   Evon Slack, PA-C  fluticasone (FLONASE) 50 MCG/ACT nasal spray Place 2 sprays into both nostrils daily. 11/19/17   Evon Slack, PA-C    Family History Family History  Problem Relation Age  of Onset  . Cancer Mother 70       breast  . Thyroid disease Mother   . CAD Father   . Hypertension Father   . Heart attack Father   . Heart attack Maternal Grandfather   . Heart disease Paternal Grandmother   . Lung disease Paternal Grandfather   . Cancer Sister        breast  . Hypertension Sister     Social History Social History   Tobacco Use  . Smoking status: Former Smoker    Years: 20.00    Last attempt to quit: 09/26/2010    Years since quitting: 7.1  . Smokeless tobacco: Never Used  Substance Use Topics  . Alcohol use: Yes    Comment: occasionally/socially  . Drug use: No     Allergies   Amoxicillin   Review of Systems Review of Systems  Constitutional: Negative for fever.  HENT: Positive  for congestion, rhinorrhea, sinus pressure and sinus pain. Negative for ear discharge, sore throat, trouble swallowing and voice change.   Respiratory: Positive for cough. Negative for shortness of breath, wheezing and stridor.   Cardiovascular: Negative for chest pain.  Gastrointestinal: Negative for abdominal pain, diarrhea, nausea and vomiting.  Genitourinary: Negative for dysuria, flank pain and pelvic pain.  Musculoskeletal: Positive for myalgias. Negative for back pain.  Skin: Negative for rash.  Neurological: Negative for dizziness and headaches.     Physical Exam Triage Vital Signs ED Triage Vitals  Enc Vitals Group     BP 11/19/17 0908 (!) 151/78     Pulse Rate 11/19/17 0908 79     Resp 11/19/17 0908 16     Temp 11/19/17 0908 98.1 F (36.7 C)     Temp Source 11/19/17 0908 Oral     SpO2 11/19/17 0908 100 %     Weight 11/19/17 0906 241 lb (109.3 kg)     Height 11/19/17 0906 5\' 8"  (1.727 m)     Head Circumference --      Peak Flow --      Pain Score 11/19/17 0906 4     Pain Loc --      Pain Edu? --      Excl. in GC? --    No data found.  Updated Vital Signs BP (!) 151/78 (BP Location: Right Arm)   Pulse 79   Temp 98.1 F (36.7 C) (Oral)   Resp 16   Ht 5\' 8"  (1.727 m)   Wt 241 lb (109.3 kg)   LMP  (LMP Unknown)   SpO2 100%   BMI 36.64 kg/m   Visual Acuity Right Eye Distance:   Left Eye Distance:   Bilateral Distance:    Right Eye Near:   Left Eye Near:    Bilateral Near:     Physical Exam  Constitutional: She is oriented to person, place, and time. She appears well-developed and well-nourished. No distress.  HENT:  Head: Normocephalic and atraumatic.  Right Ear: Hearing, tympanic membrane, external ear and ear canal normal.  Left Ear: Hearing, tympanic membrane, external ear and ear canal normal.  Nose: Rhinorrhea present.  Mouth/Throat: Mucous membranes are normal. No trismus in the jaw. No uvula swelling. Posterior oropharyngeal erythema present.  No oropharyngeal exudate, posterior oropharyngeal edema or tonsillar abscesses. No tonsillar exudate.  Positive maxillary sinus tenderness to palpation.  Tenderness is moderate.  No significant swelling.  Eyes: Conjunctivae are normal. Right eye exhibits no discharge. Left eye exhibits no discharge.  Neck: Normal  range of motion.  Cardiovascular: Normal rate and regular rhythm.  Pulmonary/Chest: Effort normal and breath sounds normal. No stridor. No respiratory distress. She has no wheezes. She has no rales.  Abdominal: Soft. She exhibits no distension. There is no tenderness.  Musculoskeletal: Normal range of motion. She exhibits no deformity.  Lymphadenopathy:    She has cervical adenopathy.  Neurological: She is alert and oriented to person, place, and time. She has normal reflexes.  Skin: Skin is warm and dry.  Psychiatric: She has a normal mood and affect. Her behavior is normal. Thought content normal.     UC Treatments / Results  Labs (all labs ordered are listed, but only abnormal results are displayed) Labs Reviewed - No data to display  EKG  EKG Interpretation None       Radiology No results found.  Procedures Procedures (including critical care time)  Medications Ordered in UC Medications - No data to display   Initial Impression / Assessment and Plan / UC Course  I have reviewed the triage vital signs and the nursing notes.  Pertinent labs & imaging results that were available during my care of the patient were reviewed by me and considered in my medical decision making (see chart for details).    72 year old female with acute maxillary sinusitis.  She is started on doxycycline.  She is allergic to penicillin.  She will use Flonase.  She will increase fluids.  She will continue with Tessalon Perles and over-the-counter decongestant. Final Clinical Impressions(s) / UC Diagnoses   Final diagnoses:  Cough  Acute non-recurrent maxillary sinusitis    ED  Discharge Orders        Ordered    doxycycline (VIBRAMYCIN) 100 MG capsule  2 times daily     11/19/17 0952    fluticasone (FLONASE) 50 MCG/ACT nasal spray  Daily     11/19/17 0952         Evon SlackGaines, Thomas C, PA-C 11/19/17 [phone]

## 2017-11-22 ENCOUNTER — Telehealth: Payer: Self-pay | Admitting: Emergency Medicine

## 2017-11-22 NOTE — Telephone Encounter (Signed)
Called to follow up after patient's recent visit. Patient states she is feeling better. 

## 2018-07-29 DIAGNOSIS — Z23 Encounter for immunization: Secondary | ICD-10-CM | POA: Diagnosis not present

## 2018-10-25 ENCOUNTER — Telehealth: Payer: Self-pay | Admitting: Unknown Physician Specialty

## 2018-10-25 NOTE — Telephone Encounter (Signed)
Copied from CRM [id number]. Topic: Medicare AWV >> Oct 25, 2018  2:22 PM Earlyne Iba wrote: Called to schedule Medicare Annual Wellness Visit with the Nurse Health Advisor.   If patient returns call, please note: their last AWV was 10/18/2017, please schedule AWV with [facility] any date AFTER 10/18/2018.  She has an appointment for a Physical on 11/12/2018 at 10:00 AM.  Please see if the patient can schedule the Medicare Annual Wellness Visit before her Physical appointment.    Thank you! For any questions please contact: Trixie Rude at [phone] or Skype [email or web address]  Patient returned call in regards to scheduling Medicare Annual Wellness Visit.  Patient DECLINED THE AWV.  Trixie Rude, Care Guide.

## 2018-11-12 ENCOUNTER — Other Ambulatory Visit: Payer: Self-pay

## 2018-11-12 ENCOUNTER — Encounter: Payer: Self-pay | Admitting: Unknown Physician Specialty

## 2018-11-12 ENCOUNTER — Ambulatory Visit (INDEPENDENT_AMBULATORY_CARE_PROVIDER_SITE_OTHER): Payer: Medicare Other | Admitting: Nurse Practitioner

## 2018-11-12 ENCOUNTER — Ambulatory Visit (INDEPENDENT_AMBULATORY_CARE_PROVIDER_SITE_OTHER): Payer: Medicare Other

## 2018-11-12 ENCOUNTER — Encounter: Payer: Self-pay | Admitting: Nurse Practitioner

## 2018-11-12 VITALS — BP 122/85 | HR 76 | Temp 98.2°F | Ht 67.0 in | Wt 243.0 lb

## 2018-11-12 VITALS — BP 122/85 | HR 76 | Temp 98.2°F | Resp 15 | Ht 67.0 in | Wt 243.0 lb

## 2018-11-12 DIAGNOSIS — E559 Vitamin D deficiency, unspecified: Secondary | ICD-10-CM

## 2018-11-12 DIAGNOSIS — E669 Obesity, unspecified: Secondary | ICD-10-CM | POA: Insufficient documentation

## 2018-11-12 DIAGNOSIS — I10 Essential (primary) hypertension: Secondary | ICD-10-CM

## 2018-11-12 DIAGNOSIS — Z0000 Encounter for general adult medical examination without abnormal findings: Secondary | ICD-10-CM

## 2018-11-12 DIAGNOSIS — E039 Hypothyroidism, unspecified: Secondary | ICD-10-CM | POA: Diagnosis not present

## 2018-11-12 DIAGNOSIS — Z6838 Body mass index (BMI) 38.0-38.9, adult: Secondary | ICD-10-CM

## 2018-11-12 DIAGNOSIS — Z1239 Encounter for other screening for malignant neoplasm of breast: Secondary | ICD-10-CM

## 2018-11-12 DIAGNOSIS — E782 Mixed hyperlipidemia: Secondary | ICD-10-CM

## 2018-11-12 DIAGNOSIS — E6609 Other obesity due to excess calories: Secondary | ICD-10-CM | POA: Diagnosis not present

## 2018-11-12 DIAGNOSIS — Z1159 Encounter for screening for other viral diseases: Secondary | ICD-10-CM

## 2018-11-12 MED ORDER — LEVOTHYROXINE SODIUM 50 MCG PO TABS: 50.0000 ug | ORAL_TABLET | Freq: Every day | ORAL | 3 refills | Status: DC

## 2018-11-12 MED ORDER — SIMVASTATIN 40 MG PO TABS: 40.0000 mg | ORAL_TABLET | Freq: Every day | ORAL | 3 refills | Status: DC

## 2018-11-12 NOTE — Progress Notes (Signed)
Subjective:   Wanda White is a 73 y.o. female who presents for Medicare Annual (Subsequent) preventive examination.  Review of Systems:  Cardiac Risk Factors include: dyslipidemia;hypertension;smoking/ tobacco exposure;obesity (BMI >30kg/m2);advanced age (>74men, >8 women)     Objective:     Vitals: BP 122/85   Pulse 76   Temp 98.2 F (36.8 C) (Oral)   Resp 15   Ht 5\' 7"  (1.702 m)   Wt 243 lb (110.2 kg)   LMP  (LMP Unknown)   BMI 38.06 kg/m   Body mass index is 38.06 kg/m.  Advanced Directives 11/12/2018 11/19/2017 10/18/2017 09/02/2016  Does Patient Have a Medical Advance Directive? No Yes No No  Type of Advance Directive - Healthcare Power of Attorney - -  Does patient want to make changes to medical advance directive? No - Patient declined - - -  Would patient like information on creating a medical advance directive? - - No - Patient declined -    Tobacco Social History   Tobacco Use  Smoking Status Former Smoker  . Years: 20.00  . Last attempt to quit: 09/26/2010  . Years since quitting: 8.1  Smokeless Tobacco Never Used     Counseling given: Not Answered   Clinical Intake:  Pre-visit preparation completed: Yes  Pain : No/denies pain     Nutritional Status: BMI > 30  Obese Nutritional Risks: None Diabetes: No  How often do you need to have someone help you when you read instructions, pamphlets, or other written materials from your doctor or pharmacy?: 1 - Never What is the last grade level you completed in school?: high school   Interpreter Needed?: No  Information entered by :: Misako Roeder,LPN   Past Medical History:  Diagnosis Date  . Depression   . Hyperlipidemia   . Hypothyroid    Past Surgical History:  Procedure Laterality Date  . CATARACT EXTRACTION  2004  . JOINT REPLACEMENT  01/2016   left shoulder  . SHOULDER SURGERY    . THYROID SURGERY     gland removed  . TUBAL LIGATION     Family History  Problem Relation Age of Onset    . Cancer Mother 82       breast  . Thyroid disease Mother   . CAD Father   . Hypertension Father   . Heart attack Father   . Heart attack Maternal Grandfather   . Heart disease Paternal Grandmother   . Lung disease Paternal Grandfather   . Cancer Sister        breast  . Hypertension Sister    Social History   Socioeconomic History  . Marital status: Widowed    Spouse name: Not on file  . Number of children: Not on file  . Years of education: Not on file  . Highest education level: High school graduate  Occupational History  . Not on file  Social Needs  . Financial resource strain: Not hard at all  . Food insecurity:    Worry: Never true    Inability: Not on file  . Transportation needs:    Medical: No    Non-medical: No  Tobacco Use  . Smoking status: Former Smoker    Years: 20.00    Last attempt to quit: 09/26/2010    Years since quitting: 8.1  . Smokeless tobacco: Never Used  Substance and Sexual Activity  . Alcohol use: Yes    Comment: occasionally/socially  . Drug use: No  . Sexual activity: Never  Lifestyle  . Physical activity:    Days per week: 0 days    Minutes per session: Not on file  . Stress: Not at all  Relationships  . Social connections:    Talks on phone: More than three times a week    Gets together: More than three times a week    Attends religious service: 1 to 4 times per year    Active member of club or organization: No    Attends meetings of clubs or organizations: Never    Relationship status: Widowed  Other Topics Concern  . Not on file  Social History Narrative  . Not on file    Outpatient Encounter Medications as of 11/12/2018  Medication Sig  . aspirin EC 81 MG tablet Take 81 mg by mouth daily.  . Calcium Carb-Cholecalciferol (CALCIUM + D3) 600-200 MG-UNIT TABS Take 600 mg by mouth 2 (two) times daily. Reported on 01/19/2016  . Cholecalciferol (VITAMIN D-3 PO) Take 1,000 Units by mouth daily. Reported on 01/19/2016  .  levothyroxine (SYNTHROID, LEVOTHROID) 50 MCG tablet Take 1 tablet (50 mcg total) by mouth daily.  . Magnesium 250 MG TABS Take 250 mg by mouth daily. Reported on 01/19/2016  . Multiple Vitamin (MULTIVITAMIN) tablet Take 1 tablet by mouth daily. Reported on 01/19/2016  . Omega-3 Fatty Acids (FISH OIL) 1000 MG CAPS Take 1,000 mg by mouth 2 (two) times daily. Reported on 01/19/2016  . simvastatin (ZOCOR) 40 MG tablet Take 1 tablet (40 mg total) by mouth at bedtime.  . vitamin C (ASCORBIC ACID) 500 MG tablet Take 500 mg by mouth daily. Reported on 01/19/2016  . vitamin E 400 UNIT capsule Take 400 Units by mouth daily. Reported on 01/19/2016  . fluticasone (FLONASE) 50 MCG/ACT nasal spray Place 2 sprays into both nostrils daily. (Patient not taking: Reported on 11/12/2018)  . [DISCONTINUED] levothyroxine (SYNTHROID, LEVOTHROID) 50 MCG tablet Take 1 tablet (50 mcg total) by mouth daily.  . [DISCONTINUED] simvastatin (ZOCOR) 40 MG tablet Take 1 tablet (40 mg total) by mouth at bedtime.   No facility-administered encounter medications on file as of 11/12/2018.     Activities of Daily Living In your present state of health, do you have any difficulty performing the following activities: 11/12/2018  Hearing? N  Vision? N  Difficulty concentrating or making decisions? N  Walking or climbing stairs? N  Dressing or bathing? N  Doing errands, shopping? N  Preparing Food and eating ? N  Using the Toilet? N  In the past six months, have you accidently leaked urine? N  Do you have problems with loss of bowel control? N  Managing your Medications? N  Managing your Finances? N  Housekeeping or managing your Housekeeping? N  Some recent data might be hidden    Patient Care Team: Marjie Skiff, NP as PCP - General (Nurse Practitioner)    Assessment:   This is a routine wellness examination for Wanda White.  Exercise Activities and Dietary recommendations Current Exercise Habits: Home exercise routine, Type  of exercise: walking, Time (Minutes): 30(1.5 miles ), Frequency (Times/Week): 3, Weekly Exercise (Minutes/Week): 90, Intensity: Mild, Exercise limited by: None identified  Goals    . DIET - INCREASE WATER INTAKE     Recommend drinking at last 6-8 glasses of water a day        Fall Risk Fall Risk  11/12/2018 10/18/2017 09/02/2016 01/19/2016 09/18/2015  Falls in the past year? 0 No No No No  Number falls in past  yr: 0 - - - -  Injury with Fall? 0 - - - -   FALL RISK PREVENTION PERTAINING TO THE HOME:  Any stairs in or around the home WITH handrails? Yes  stairs with handrails outside  Home free of loose throw rugs in walkways, pet beds, electrical cords, etc? Yes  Adequate lighting in your home to reduce risk of falls? Yes   ASSISTIVE DEVICES UTILIZED TO PREVENT FALLS:  Life alert? No  Use of a cane, walker or w/c? No  Grab bars in the bathroom? Yes  Shower chair or bench in shower? No  Elevated toilet seat or a handicapped toilet? No   DME ORDERS:  DME order needed?  No   TIMED UP AND GO:  Was the test performed? Yes .  Length of time to ambulate 10 feet: 11 sec.   GAIT:  Appearance of gait: Gait stead-fast without the use of an assistive device. Education: Fall risk prevention has been discussed.  Intervention(s) required? No    Depression Screen PHQ 2/9 Scores 11/12/2018 10/18/2017 09/02/2016 01/19/2016  PHQ - 2 Score 0 0 0 0  PHQ- 9 Score - 1 - -     Cognitive Function     6CIT Screen 11/12/2018 10/18/2017  What Year? 0 points 0 points  What month? 0 points 0 points  What time? 0 points 0 points  Count back from 20 0 points 0 points  Months in reverse 0 points 0 points  Repeat phrase 0 points 0 points  Total Score 0 0    Immunization History  Administered Date(s) Administered  . Influenza, High Dose Seasonal PF 07/29/2018  . Influenza-Unspecified 11/21/2014, 08/22/2016  . Pneumococcal Conjugate-13 03/18/2014  . Pneumococcal Polysaccharide-23 04/02/2013  .  Td 02/10/2004  . Tdap 07/28/2011    Qualifies for Shingles Vaccine?Yes   Due for Shingrix. Education has been provided regarding the importance of this vaccine. Pt has been advised to call insurance company to determine out of pocket expense. Advised may also receive vaccine at local pharmacy or Health Dept. Verbalized acceptance and understanding.  Tdap: up to date   Flu Vaccine: up to date   Pneumococcal Vaccine: up to date   Screening Tests Health Maintenance  Topic Date Due  . Hepatitis C Screening  1946-03-29  . MAMMOGRAM  03/14/2014  . TETANUS/TDAP  07/27/2021  . COLONOSCOPY  03/05/2022  . INFLUENZA VACCINE  Completed  . DEXA SCAN  Completed  . PNA vac Low Risk Adult  Completed    Cancer Screenings:  Colorectal Screening: Completed 03/05/2012. Repeat every 10 years  Mammogram: Completed 03/20/2012. Repeat every year;  Ordered today . Pt provided with contact info and advised to call to schedule appt. Pt aware the office will call re: appt.  Bone Density: Completed 12/24/2014.   Lung Cancer Screening: (Low Dose CT Chest recommended if Age 18-80 years, 30 pack-year currently smoking OR have quit w/in 15years.) does qualify.  DECLINED  Additional Screening:  Hepatitis C Screening: does qualify; Completed 11/12/2018  Vision Screening: Recommended annual ophthalmology exams for early detection of glaucoma and other disorders of the eye. Is the patient up to date with their annual eye exam?  Yes  Who is the provider or what is the name of the office in which the pt attends annual eye exams?    Dental Screening: Recommended annual dental exams for proper oral hygiene  Community Resource Referral:  CRR required this visit?  No      Plan:  I have personally reviewed and addressed the Medicare Annual Wellness questionnaire and have noted the following in the patient's chart:  A. Medical and social history B. Use of alcohol, tobacco or illicit drugs  C. Current  medications and supplements D. Functional ability and status E.  Nutritional status F.  Physical activity G. Advance directives H. List of other physicians I.  Hospitalizations, surgeries, and ER visits in previous 12 months J.  Vitals K. Screenings such as hearing and vision if needed, cognitive and depression L. Referrals and appointments   In addition, I have reviewed and discussed with patient certain preventive protocols, quality metrics, and best practice recommendations. A written personalized care plan for preventive services as well as general preventive health recommendations were provided to patient.   Signed,  Marin Robertsiffany Fortunato Nordin, LPN Nurse Health Advisor   Nurse Notes:none

## 2018-11-12 NOTE — Progress Notes (Signed)
BP 122/85   Pulse 76   Temp 98.2 F (36.8 C) (Oral)   Ht 5\' 7"  (1.702 m)   Wt 243 lb (110.2 kg)   LMP  (LMP Unknown)   SpO2 97%   BMI 38.06 kg/m    Subjective:    Patient ID: Wanda White, female    DOB: 14-Jun-1946, 72 y.o.   MRN: [id number]  HPI: Wanda White is a 73 y.o. female presents for follow-up  Chief Complaint  Patient presents with  . Follow-up   HYPERTENSION / HYPERLIPIDEMIA Satisfied with current treatment? no current medications for BP Duration of hypertension: years BP monitoring frequency: a few times a week BP range: 110/70 range at home Duration of hyperlipidemia: years Cholesterol medication side effects: no Cholesterol supplements: none Medication compliance: good compliance Aspirin: yes Recent stressors: no Recurrent headaches: no Visual changes: no Palpitations: no Dyspnea: no Chest pain: no Lower extremity edema: no Dizzy/lightheaded: no   HYPOTHYROIDISM Current Levothyroxine 50 MCG daily.  Had surgery in 80's with removal of one thyroid gland per her report. Thyroid control status:stable Satisfied with current treatment? yes Medication side effects: no Medication compliance: good compliance Etiology of hypothyroidism:  Recent dose adjustment:no Fatigue: no Cold intolerance: no Heat intolerance: no Weight gain: no Weight loss: no Constipation: no Diarrhea/loose stools: no Palpitations: no Lower extremity edema: no Anxiety/depressed mood: no  Relevant past medical, surgical, family and social history reviewed and updated as indicated. Interim medical history since our last visit reviewed. Allergies and medications reviewed and updated.  Review of Systems  Constitutional: Negative for activity change, appetite change, diaphoresis, fatigue and fever.  Respiratory: Negative for cough, chest tightness and shortness of breath.   Cardiovascular: Negative for chest pain, palpitations and leg swelling.  Gastrointestinal: Negative  for abdominal distention, abdominal pain, constipation, diarrhea, nausea and vomiting.  Endocrine: Negative for cold intolerance, heat intolerance, polydipsia, polyphagia and polyuria.  Neurological: Negative for dizziness, syncope, weakness, light-headedness, numbness and headaches.  Psychiatric/Behavioral: Negative.     Per HPI unless specifically indicated above     Objective:    BP 122/85   Pulse 76   Temp 98.2 F (36.8 C) (Oral)   Ht 5\' 7"  (1.702 m)   Wt 243 lb (110.2 kg)   LMP  (LMP Unknown)   SpO2 97%   BMI 38.06 kg/m   Wt Readings from Last 3 Encounters:  11/12/18 243 lb (110.2 kg)  11/19/17 241 lb (109.3 kg)  11/06/17 241 lb 3.2 oz (109.4 kg)    Physical Exam Vitals signs and nursing note reviewed.  Constitutional:      General: She is awake.     Appearance: She is well-developed.  HENT:     Head: Normocephalic.     Right Ear: Hearing normal.     Left Ear: Hearing normal.     Nose: Nose normal.     Mouth/Throat:     Mouth: Mucous membranes are moist.  Eyes:     General: Lids are normal.        Right eye: No discharge.        Left eye: No discharge.     Conjunctiva/sclera: Conjunctivae normal.     Pupils: Pupils are equal, round, and reactive to light.  Neck:     Musculoskeletal: Normal range of motion and neck supple.     Thyroid: No thyromegaly.     Vascular: No carotid bruit or JVD.  Cardiovascular:     Rate and Rhythm: Normal rate  and regular rhythm.     Heart sounds: Normal heart sounds. No murmur. No gallop.   Pulmonary:     Effort: Pulmonary effort is normal.     Breath sounds: Normal breath sounds.  Abdominal:     General: Bowel sounds are normal.     Palpations: Abdomen is soft. There is no hepatomegaly or splenomegaly.  Musculoskeletal:     Right lower leg: No edema.     Left lower leg: No edema.  Lymphadenopathy:     Cervical: No cervical adenopathy.  Skin:    General: Skin is warm and dry.  Neurological:     Mental Status: She is  alert and oriented to person, place, and time.  Psychiatric:        Attention and Perception: Attention normal.        Mood and Affect: Mood normal.        Behavior: Behavior normal. Behavior is cooperative.        Thought Content: Thought content normal.        Judgment: Judgment normal.     Results for orders placed or performed in visit on 11/06/17  Lipid Panel w/o Chol/HDL Ratio  Result Value Ref Range   Cholesterol, Total 179 100 - 199 mg/dL   Triglycerides 672 0 - 149 mg/dL   HDL 55 >09 mg/dL   VLDL Cholesterol Cal 26 5 - 40 mg/dL   LDL Calculated 98 0 - 99 mg/dL  TSH  Result Value Ref Range   TSH 2.810 0.450 - 4.500 uIU/mL      Assessment & Plan:   Problem List Items Addressed This Visit      Cardiovascular and Mediastinum   Hypertension - Primary    Chronic, stable without medication.  Continue current plan of care.  BP below goal today.      Relevant Medications   simvastatin (ZOCOR) 40 MG tablet   Other Relevant Orders   Comprehensive metabolic panel   CBC with Differential/Platelet   Magnesium     Endocrine   Hypothyroid    Recheck thyroid panel today.  Adjust Levothyroxine dose as needed.      Relevant Medications   levothyroxine (SYNTHROID, LEVOTHROID) 50 MCG tablet   Other Relevant Orders   Thyroid Panel With TSH     Other   Hyperlipidemia    Chronic, ongoing.  Continue Simvastatin.  Recheck lipid panel and CMP today.      Relevant Medications   simvastatin (ZOCOR) 40 MG tablet   Other Relevant Orders   Lipid Panel w/o Chol/HDL Ratio   Obesity    Recommend focus on healthy diet choices and regular exercise regimen (30 minutes 5 days a week).       Other Visit Diagnoses    Vitamin D deficiency       Check Vitamin D level today and continue supplement.   Relevant Orders   Vitamin D (25 hydroxy)   Encounter for hepatitis C screening test for low risk patient       Hep C screening ordered.   Relevant Orders   Hepatitis C antibody         Follow up plan: Return in about 6 months (around 05/13/2019) for HTN/HLD and Hypothyroid.

## 2018-11-12 NOTE — Patient Instructions (Signed)
Fat and Cholesterol Restricted Eating Plan Getting too much fat and cholesterol in your diet may cause health problems. Choosing the right foods helps keep your fat and cholesterol at normal levels. This can keep you from getting certain diseases. Your doctor may recommend an eating plan that includes:  Total fat: ______% or less of total calories a day.  Saturated fat: ______% or less of total calories a day.  Cholesterol: less than _________mg a day.  Fiber: ______g a day. What are tips for following this plan? Meal planning  At meals, divide your plate into four equal parts: ? Fill one-half of your plate with vegetables and green salads. ? Fill one-fourth of your plate with whole grains. ? Fill one-fourth of your plate with low-fat (lean) protein foods.  Eat fish that is high in omega-3 fats at least two times a week. This includes mackerel, tuna, sardines, and salmon.  Eat foods that are high in fiber, such as whole grains, beans, apples, broccoli, carrots, peas, and barley. General tips   Work with your doctor to lose weight if you need to.  Avoid: ? Foods with added sugar. ? Fried foods. ? Foods with partially hydrogenated oils.  Limit alcohol intake to no more than 1 drink a day for nonpregnant women and 2 drinks a day for men. One drink equals 12 oz of beer, 5 oz of wine, or 1 oz of hard liquor. Reading food labels  Check food labels for: ? Trans fats. ? Partially hydrogenated oils. ? Saturated fat (g) in each serving. ? Cholesterol (mg) in each serving. ? Fiber (g) in each serving.  Choose foods with healthy fats, such as: ? Monounsaturated fats. ? Polyunsaturated fats. ? Omega-3 fats.  Choose grain products that have whole grains. Look for the word "whole" as the first word in the ingredient list. Cooking  Cook foods using low-fat methods. These include baking, boiling, grilling, and broiling.  Eat more home-cooked foods. Eat at restaurants and buffets  less often.  Avoid cooking using saturated fats, such as butter, cream, palm oil, palm kernel oil, and coconut oil. Recommended foods  Fruits  All fresh, canned (in natural juice), or frozen fruits. Vegetables  Fresh or frozen vegetables (raw, steamed, roasted, or grilled). Green salads. Grains  Whole grains, such as whole wheat or whole grain breads, crackers, cereals, and pasta. Unsweetened oatmeal, bulgur, barley, quinoa, or brown rice. Corn or whole wheat flour tortillas. Meats and other protein foods  Ground beef (85% or leaner), grass-fed beef, or beef trimmed of fat. Skinless chicken or turkey. Ground chicken or turkey. Pork trimmed of fat. All fish and seafood. Egg whites. Dried beans, peas, or lentils. Unsalted nuts or seeds. Unsalted canned beans. Nut butters without added sugar or oil. Dairy  Low-fat or nonfat dairy products, such as skim or 1% milk, 2% or reduced-fat cheeses, low-fat and fat-free ricotta or cottage cheese, or plain low-fat and nonfat yogurt. Fats and oils  Tub margarine without trans fats. Light or reduced-fat mayonnaise and salad dressings. Avocado. Olive, canola, sesame, or safflower oils. The items listed above may not be a complete list of foods and beverages you can eat. Contact a dietitian for more information. Foods to avoid Fruits  Canned fruit in heavy syrup. Fruit in cream or butter sauce. Fried fruit. Vegetables  Vegetables cooked in cheese, cream, or butter sauce. Fried vegetables. Grains  White bread. White pasta. White rice. Cornbread. Bagels, pastries, and croissants. Crackers and snack foods that contain trans fat   and hydrogenated oils. Meats and other protein foods  Fatty cuts of meat. Ribs, chicken wings, bacon, sausage, bologna, salami, chitterlings, fatback, hot dogs, bratwurst, and packaged lunch meats. Liver and organ meats. Whole eggs and egg yolks. Chicken and turkey with skin. Fried meat. Dairy  Whole or 2% milk, cream,  half-and-half, and cream cheese. Whole milk cheeses. Whole-fat or sweetened yogurt. Full-fat cheeses. Nondairy creamers and whipped toppings. Processed cheese, cheese spreads, and cheese curds. Beverages  Alcohol. Sugar-sweetened drinks such as sodas, lemonade, and fruit drinks. Fats and oils  Butter, stick margarine, lard, shortening, ghee, or bacon fat. Coconut, palm kernel, and palm oils. Sweets and desserts  Corn syrup, sugars, honey, and molasses. Candy. Jam and jelly. Syrup. Sweetened cereals. Cookies, pies, cakes, donuts, muffins, and ice cream. The items listed above may not be a complete list of foods and beverages you should avoid. Contact a dietitian for more information. Summary  Choosing the right foods helps keep your fat and cholesterol at normal levels. This can keep you from getting certain diseases.  At meals, fill one-half of your plate with vegetables and green salads.  Eat high-fiber foods, like whole grains, beans, apples, carrots, peas, and barley.  Limit added sugar, saturated fats, alcohol, and fried foods. This information is not intended to replace advice given to you by your health care provider. Make sure you discuss any questions you have with your health care provider. Document Released: 03/13/2012 Document Revised: 05/16/2018 Document Reviewed: 05/30/2017 Elsevier Interactive Patient Education  2019 [employer]   

## 2018-11-12 NOTE — Assessment & Plan Note (Signed)
Chronic, stable without medication.  Continue current plan of care.  BP below goal today.

## 2018-11-12 NOTE — Assessment & Plan Note (Signed)
Recheck thyroid panel today.  Adjust Levothyroxine dose as needed.

## 2018-11-12 NOTE — Patient Instructions (Addendum)
Wanda White , Thank you for taking time to come for your Medicare Wellness Visit. I appreciate your ongoing commitment to your health goals. Please review the following plan we discussed and let me know if I can assist you in the future.   Screening recommendations/referrals: Colonoscopy: completed 03/05/2012 Mammogram: completed 03/20/2012, due now  Please call [phone] to schedule your mammogram.  Bone Density: completed 12/24/2014 Recommended yearly ophthalmology/optometry visit for glaucoma screening and checkup Recommended yearly dental visit for hygiene and checkup  Vaccinations: Influenza vaccine: up to date Pneumococcal vaccine: up to date Tdap vaccine: due, check with your insurance company for coverage Shingles vaccine: shingrix eligible, check with your insurance company for coverage     Advanced directives: Advance directive discussed with you today. Even though you declined this today please call our office should you change your mind and we can give you the proper paperwork for you to fill out.  Conditions/risks identified: none   Next appointment: Follow up in one year for your annual wellness exam.   Preventive Care 65 Years and Older, Female Preventive care refers to lifestyle choices and visits with your health care provider that can promote health and wellness. What does preventive care include?  A yearly physical exam. This is also called an annual well check.  Dental exams once or twice a year.  Routine eye exams. Ask your health care provider how often you should have your eyes checked.  Personal lifestyle choices, including:  Daily care of your teeth and gums.  Regular physical activity.  Eating a healthy diet.  Avoiding tobacco and drug use.  Limiting alcohol use.  Practicing safe sex.  Taking low-dose aspirin every day.  Taking vitamin and mineral supplements as recommended by your health care provider. What happens during an annual well  check? The services and screenings done by your health care provider during your annual well check will depend on your age, overall health, lifestyle risk factors, and family history of disease. Counseling  Your health care provider may ask you questions about your:  Alcohol use.  Tobacco use.  Drug use.  Emotional well-being.  Home and relationship well-being.  Sexual activity.  Eating habits.  History of falls.  Memory and ability to understand (cognition).  Work and work Astronomer.  Reproductive health. Screening  You may have the following tests or measurements:  Height, weight, and BMI.  Blood pressure.  Lipid and cholesterol levels. These may be checked every 5 years, or more frequently if you are over 57 years old.  Skin check.  Lung cancer screening. You may have this screening every year starting at age 59 if you have a 30-pack-year history of smoking and currently smoke or have quit within the past 15 years.  Fecal occult blood test (FOBT) of the stool. You may have this test every year starting at age 3.  Flexible sigmoidoscopy or colonoscopy. You may have a sigmoidoscopy every 5 years or a colonoscopy every 10 years starting at age 18.  Hepatitis C blood test.  Hepatitis B blood test.  Sexually transmitted disease (STD) testing.  Diabetes screening. This is done by checking your blood sugar (glucose) after you have not eaten for a while (fasting). You may have this done every 1-3 years.  Bone density scan. This is done to screen for osteoporosis. You may have this done starting at age 4.  Mammogram. This may be done every 1-2 years. Talk to your health care provider about how often you should have  regular mammograms. Talk with your health care provider about your test results, treatment options, and if necessary, the need for more tests. Vaccines  Your health care provider may recommend certain vaccines, such as:  Influenza vaccine. This is  recommended every year.  Tetanus, diphtheria, and acellular pertussis (Tdap, Td) vaccine. You may need a Td booster every 10 years.  Zoster vaccine. You may need this after age 28.  Pneumococcal 13-valent conjugate (PCV13) vaccine. One dose is recommended after age 55.  Pneumococcal polysaccharide (PPSV23) vaccine. One dose is recommended after age 57. Talk to your health care provider about which screenings and vaccines you need and how often you need them. This information is not intended to replace advice given to you by your health care provider. Make sure you discuss any questions you have with your health care provider. Document Released: 10/09/2015 Document Revised: 06/01/2016 Document Reviewed: 07/14/2015 Elsevier Interactive Patient Education  2017 [place] Prevention in the Home Falls can cause injuries. They can happen to people of all ages. There are many things you can do to make your home safe and to help prevent falls. What can I do on the outside of my home?  Regularly fix the edges of walkways and driveways and fix any cracks.  Remove anything that might make you trip as you walk through a door, such as a raised step or threshold.  Trim any bushes or trees on the path to your home.  Use bright outdoor lighting.  Clear any walking paths of anything that might make someone trip, such as rocks or tools.  Regularly check to see if handrails are loose or broken. Make sure that both sides of any steps have handrails.  Any raised decks and porches should have guardrails on the edges.  Have any leaves, snow, or ice cleared regularly.  Use sand or salt on walking paths during winter.  Clean up any spills in your garage right away. This includes oil or grease spills. What can I do in the bathroom?  Use night lights.  Install grab bars by the toilet and in the tub and shower. Do not use towel bars as grab bars.  Use non-skid mats or decals in the tub or  shower.  If you need to sit down in the shower, use a plastic, non-slip stool.  Keep the floor dry. Clean up any water that spills on the floor as soon as it happens.  Remove soap buildup in the tub or shower regularly.  Attach bath mats securely with double-sided non-slip rug tape.  Do not have throw rugs and other things on the floor that can make you trip. What can I do in the bedroom?  Use night lights.  Make sure that you have a light by your bed that is easy to reach.  Do not use any sheets or blankets that are too big for your bed. They should not hang down onto the floor.  Have a firm chair that has side arms. You can use this for support while you get dressed.  Do not have throw rugs and other things on the floor that can make you trip. What can I do in the kitchen?  Clean up any spills right away.  Avoid walking on wet floors.  Keep items that you use a lot in easy-to-reach places.  If you need to reach something above you, use a strong step stool that has a grab bar.  Keep electrical cords out of the  way.  Do not use floor polish or wax that makes floors slippery. If you must use wax, use non-skid floor wax.  Do not have throw rugs and other things on the floor that can make you trip. What can I do with my stairs?  Do not leave any items on the stairs.  Make sure that there are handrails on both sides of the stairs and use them. Fix handrails that are broken or loose. Make sure that handrails are as long as the stairways.  Check any carpeting to make sure that it is firmly attached to the stairs. Fix any carpet that is loose or worn.  Avoid having throw rugs at the top or bottom of the stairs. If you do have throw rugs, attach them to the floor with carpet tape.  Make sure that you have a light switch at the top of the stairs and the bottom of the stairs. If you do not have them, ask someone to add them for you. What else can I do to help prevent  falls?  Wear shoes that:  Do not have high heels.  Have rubber bottoms.  Are comfortable and fit you well.  Are closed at the toe. Do not wear sandals.  If you use a stepladder:  Make sure that it is fully opened. Do not climb a closed stepladder.  Make sure that both sides of the stepladder are locked into place.  Ask someone to hold it for you, if possible.  Clearly mark and make sure that you can see:  Any grab bars or handrails.  First and last steps.  Where the edge of each step is.  Use tools that help you move around (mobility aids) if they are needed. These include:  Canes.  Walkers.  Scooters.  Crutches.  Turn on the lights when you go into a dark area. Replace any light bulbs as soon as they burn out.  Set up your furniture so you have a clear path. Avoid moving your furniture around.  If any of your floors are uneven, fix them.  If there are any pets around you, be aware of where they are.  Review your medicines with your doctor. Some medicines can make you feel dizzy. This can increase your chance of falling. Ask your doctor what other things that you can do to help prevent falls. This information is not intended to replace advice given to you by your health care provider. Make sure you discuss any questions you have with your health care provider. Document Released: 07/09/2009 Document Revised: 02/18/2016 Document Reviewed: 10/17/2014 Elsevier Interactive Patient Education  2017 [employer].

## 2018-11-12 NOTE — Assessment & Plan Note (Signed)
Recommend focus on healthy diet choices and regular exercise regimen (30 minutes 5 days a week).

## 2018-11-12 NOTE — Assessment & Plan Note (Signed)
Chronic, ongoing.  Continue Simvastatin.  Recheck lipid panel and CMP today.

## 2018-11-13 LAB — CBC WITH DIFFERENTIAL/PLATELET
Basophils Absolute: 0 x10E3/uL (ref 0.0–0.2)
Basos: 1 %
EOS (ABSOLUTE): 0 x10E3/uL (ref 0.0–0.4)
Eos: 0 %
Hematocrit: 39.1 % (ref 34.0–46.6)
Hemoglobin: 13.4 g/dL (ref 11.1–15.9)
Immature Grans (Abs): 0.1 x10E3/uL (ref 0.0–0.1)
Immature Granulocytes: 2 %
Lymphocytes Absolute: 1.4 x10E3/uL (ref 0.7–3.1)
Lymphs: 37 %
MCH: 31.8 pg (ref 26.6–33.0)
MCHC: 34.3 g/dL (ref 31.5–35.7)
MCV: 93 fL (ref 79–97)
Monocytes Absolute: 0.7 x10E3/uL (ref 0.1–0.9)
Monocytes: 18 %
Neutrophils Absolute: 1.6 x10E3/uL (ref 1.4–7.0)
Neutrophils: 42 %
Platelets: 179 x10E3/uL (ref 150–450)
RBC: 4.22 x10E6/uL (ref 3.77–5.28)
RDW: 12.3 % (ref 11.7–15.4)
WBC: 3.8 x10E3/uL (ref 3.4–10.8)

## 2018-11-13 LAB — COMPREHENSIVE METABOLIC PANEL
ALK PHOS: 69 IU/L (ref 39–117)
ALT: 24 IU/L (ref 0–32)
AST: 23 IU/L (ref 0–40)
Albumin/Globulin Ratio: 1.8 (ref 1.2–2.2)
Albumin: 4.8 g/dL (ref 3.8–4.8)
BUN/Creatinine Ratio: 13 (ref 12–28)
BUN: 13 mg/dL (ref 8–27)
Bilirubin Total: 0.5 mg/dL (ref 0.0–1.2)
CALCIUM: 9.7 mg/dL (ref 8.7–10.3)
CO2: 22 mmol/L (ref 20–29)
CREATININE: 1.04 mg/dL — AB (ref 0.57–1.00)
Chloride: 102 mmol/L (ref 96–106)
GFR calc Af Amer: 63 mL/min/{1.73_m2} (ref >59)
GFR calc non Af Amer: 55 mL/min/{1.73_m2} — ABNORMAL LOW (ref >59)
GLOBULIN, TOTAL: 2.6 g/dL (ref 1.5–4.5)
GLUCOSE: 109 mg/dL — AB (ref 65–99)
Potassium: 4.1 mmol/L (ref 3.5–5.2)
SODIUM: 139 mmol/L (ref 134–144)
Total Protein: 7.4 g/dL (ref 6.0–8.5)

## 2018-11-13 LAB — LIPID PANEL W/O CHOL/HDL RATIO
Cholesterol, Total: 178 mg/dL (ref 100–199)
HDL: 58 mg/dL (ref >39)
LDL CALC: 93 mg/dL (ref 0–99)
Triglycerides: 133 mg/dL (ref 0–149)
VLDL Cholesterol Cal: 27 mg/dL (ref 5–40)

## 2018-11-13 LAB — THYROID PANEL WITH TSH
Free Thyroxine Index: 1.8 (ref 1.2–4.9)
T3 Uptake Ratio: 26 % (ref 24–39)
T4, Total: 6.8 ug/dL (ref 4.5–12.0)
TSH: 2.87 u[IU]/mL (ref 0.450–4.500)

## 2018-11-13 LAB — MAGNESIUM: Magnesium: 2.2 mg/dL (ref 1.6–2.3)

## 2018-11-13 LAB — HEPATITIS C ANTIBODY: Hep C Virus Ab: <0.1 s/co ratio (ref 0.0–0.9)

## 2018-11-13 LAB — VITAMIN D 25 HYDROXY (VIT D DEFICIENCY, FRACTURES): Vit D, 25-Hydroxy: 46.8 ng/mL (ref 30.0–100.0)

## 2019-05-14 ENCOUNTER — Ambulatory Visit: Payer: Self-pay | Admitting: Nurse Practitioner

## 2019-07-12 ENCOUNTER — Other Ambulatory Visit: Payer: Self-pay

## 2019-07-12 ENCOUNTER — Ambulatory Visit (INDEPENDENT_AMBULATORY_CARE_PROVIDER_SITE_OTHER): Payer: Medicare Other

## 2019-07-12 DIAGNOSIS — Z23 Encounter for immunization: Secondary | ICD-10-CM | POA: Diagnosis not present

## 2019-07-31 ENCOUNTER — Ambulatory Visit: Payer: Medicare Other | Admitting: Nurse Practitioner

## 2019-09-01 ENCOUNTER — Encounter: Payer: Self-pay | Admitting: Nurse Practitioner

## 2019-09-01 DIAGNOSIS — N183 Chronic kidney disease, stage 3 unspecified: Secondary | ICD-10-CM | POA: Insufficient documentation

## 2019-09-06 ENCOUNTER — Ambulatory Visit: Payer: Medicare Other | Admitting: Nurse Practitioner

## 2019-09-06 ENCOUNTER — Other Ambulatory Visit: Payer: Self-pay

## 2019-09-06 ENCOUNTER — Encounter: Payer: Self-pay | Admitting: Nurse Practitioner

## 2019-09-06 VITALS — BP 138/81 | HR 76 | Temp 98.5°F | Ht 68.0 in | Wt 242.2 lb

## 2019-09-06 DIAGNOSIS — E782 Mixed hyperlipidemia: Secondary | ICD-10-CM

## 2019-09-06 DIAGNOSIS — N1831 Chronic kidney disease, stage 3a: Secondary | ICD-10-CM

## 2019-09-06 DIAGNOSIS — I10 Essential (primary) hypertension: Secondary | ICD-10-CM

## 2019-09-06 DIAGNOSIS — E039 Hypothyroidism, unspecified: Secondary | ICD-10-CM | POA: Diagnosis not present

## 2019-09-06 DIAGNOSIS — E6609 Other obesity due to excess calories: Secondary | ICD-10-CM

## 2019-09-06 DIAGNOSIS — M25511 Pain in right shoulder: Secondary | ICD-10-CM | POA: Insufficient documentation

## 2019-09-06 DIAGNOSIS — Z6836 Body mass index (BMI) 36.0-36.9, adult: Secondary | ICD-10-CM

## 2019-09-06 NOTE — Assessment & Plan Note (Signed)
Chronic, stable without medication.  Continue current diet focus and recommend continue to check BP at home daily.  BP below goal for age today and on home BP.

## 2019-09-06 NOTE — Assessment & Plan Note (Signed)
Intermittent, suspect rotator cuff disorder.  Normal ROM without discomfort today.  She wishes to defer imaging and PT at this time.  Wishes to try Voltaren gel and Tylenol as needed at home + work on her PT exercises.  Return to worsening or continued discomfort.

## 2019-09-06 NOTE — Assessment & Plan Note (Signed)
Chronic, stable.  Continue to monitor on labs and discussed possibly starting a low dose of Lisinopril or Losartan for kidney protection and to assist in maintaining BP.  She wishes to think about this and continue to monitor function.  CMP today.

## 2019-09-06 NOTE — Assessment & Plan Note (Signed)
Chronic, ongoing.  Continue Simvastatin.  Lipid panel next visit.

## 2019-09-06 NOTE — Patient Instructions (Signed)
Hypothyroidism  Hypothyroidism is when the thyroid gland does not make enough of certain hormones (it is underactive). The thyroid gland is a small gland located in the lower front part of the neck, just in front of the windpipe (trachea). This gland makes hormones that help control how the body uses food for energy (metabolism) as well as how the heart and brain function. These hormones also play a role in keeping your bones strong. When the thyroid is underactive, it produces too little of the hormones thyroxine (T4) and triiodothyronine (T3). What are the causes? This condition may be caused by:  Hashimoto's disease. This is a disease in which the body's disease-fighting system (immune system) attacks the thyroid gland. This is the most common cause.  Viral infections.  Pregnancy.  Certain medicines.  Birth defects.  Past radiation treatments to the head or neck for cancer.  Past treatment with radioactive iodine.  Past exposure to radiation in the environment.  Past surgical removal of part or all of the thyroid.  Problems with a gland in the center of the brain (pituitary gland).  Lack of enough iodine in the diet. What increases the risk? You are more likely to develop this condition if:  You are female.  You have a family history of thyroid conditions.  You use a medicine called lithium.  You take medicines that affect the immune system (immunosuppressants). What are the signs or symptoms? Symptoms of this condition include:  Feeling as though you have no energy (lethargy).  Not being able to tolerate cold.  Weight gain that is not explained by a change in diet or exercise habits.  Lack of appetite.  Dry skin.  Coarse hair.  Menstrual irregularity.  Slowing of thought processes.  Constipation.  Sadness or depression. How is this diagnosed? This condition may be diagnosed based on:  Your symptoms, your medical history, and a physical exam.  Blood  tests. You may also have imaging tests, such as an ultrasound or MRI. How is this treated? This condition is treated with medicine that replaces the thyroid hormones that your body does not make. After you begin treatment, it may take several weeks for symptoms to go away. Follow these instructions at home:  Take over-the-counter and prescription medicines only as told by your health care provider.  If you start taking any new medicines, tell your health care provider.  Keep all follow-up visits as told by your health care provider. This is important. ? As your condition improves, your dosage of thyroid hormone medicine may change. ? You will need to have blood tests regularly so that your health care provider can monitor your condition. Contact a health care provider if:  Your symptoms do not get better with treatment.  You are taking thyroid replacement medicine and you: ? Sweat a lot. ? Have tremors. ? Feel anxious. ? Lose weight rapidly. ? Cannot tolerate heat. ? Have emotional swings. ? Have diarrhea. ? Feel weak. Get help right away if you have:  Chest pain.  An irregular heartbeat.  A rapid heartbeat.  Difficulty breathing. Summary  Hypothyroidism is when the thyroid gland does not make enough of certain hormones (it is underactive).  When the thyroid is underactive, it produces too little of the hormones thyroxine (T4) and triiodothyronine (T3).  The most common cause is Hashimoto's disease, a disease in which the body's disease-fighting system (immune system) attacks the thyroid gland. The condition can also be caused by viral infections, medicine, pregnancy, or past   radiation treatment to the head or neck.  Symptoms may include weight gain, dry skin, constipation, feeling as though you do not have energy, and not being able to tolerate cold.  This condition is treated with medicine to replace the thyroid hormones that your body does not make. This information  is not intended to replace advice given to you by your health care provider. Make sure you discuss any questions you have with your health care provider. Document Released: 09/12/2005 Document Revised: 08/25/2017 Document Reviewed: 08/23/2017 Elsevier Patient Education  2020 [employer]  

## 2019-09-06 NOTE — Assessment & Plan Note (Signed)
Chronic, stable.  Recheck thyroid panel today.  Adjust Levothyroxine dose as needed. 

## 2019-09-06 NOTE — Progress Notes (Signed)
BP 138/81   Pulse 76   Temp 98.5 F (36.9 C) (Oral)   Ht 5\' 8"  (1.727 m)   Wt 242 lb 3.2 oz (109.9 kg)   LMP  (LMP Unknown)   SpO2 99%   BMI 36.83 kg/m    Subjective:    Patient ID: Wanda White, female    DOB: [birth year], 73 y.o.   MRN: [id number]  HPI: Wanda White is a 73 y.o. female  Chief Complaint  Patient presents with  . Hyperlipidemia  . Hypertension  . Hypothyroidism  . Shoulder Pain    R shoulder, pt states she wonders if she has arthritis or a pinched nerve in her shoulder   HYPERTENSION / HYPERLIPIDEMIA No current medications for BP, diet focus.  Continues on Simvastatin for HLD. Last LDL 93.   Satisfied with current treatment? no current medications for BP Duration of hypertension: years BP monitoring frequency: a few times a week BP range: 110-120/70 range Duration of hyperlipidemia: years Cholesterol medication side effects: no Cholesterol supplements: none Medication compliance: good compliance Aspirin: yes Recent stressors: no Recurrent headaches: no Visual changes: no Palpitations: no Dyspnea: no Chest pain: no Lower extremity edema: no Dizzy/lightheaded: no   HYPOTHYROIDISM Current Levothyroxine 50 MCG daily.  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 Last TSH 2.870 and T4 6.8. Thyroid control status:stable Satisfied with current treatment? yes Medication side effects: no Medication compliance: good compliance Etiology of hypothyroidism:  Recent dose adjustment:no Fatigue: no Cold intolerance: no Heat intolerance: no Weight gain: no Weight loss: no Constipation: no Diarrhea/loose stools: no Palpitations: no Lower extremity edema: no Anxiety/depressed mood: no  CHRONIC KIDNEY DISEASE With February GFR 55 and CRT 1.04. CKD status: stable Medications renally dose: yes Previous renal evaluation: no Pneumovax:  Up to Date Influenza Vaccine:  Up to Date   SHOULDER PAIN (RIGHT) Several times a week at night  when sleeping, like "headache in arm" + tingling in fingers, then moves arm and is okay with no further discomfort . 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  Sometimes notices pain during day, when doing quilting and crafts, but not often.  Sleeps on her right side.  Pulling arm back gives some relief.  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  Current discomfort started back in summer.  Her daughter gave her samples Voltaren gel, has not used yet.  History of rotator cuff discomfort in 2019. Duration: months Involved shoulder: right Mechanism of injury: unknown Location: lateral Onset:gradual Severity: 6/10  Quality:  aching and burning Frequency: intermittent Radiation: sometimes radiates down into hand Aggravating factors: sleep Alleviating factors: rolling over or moving arm Status: stable Treatments attempted: Blue Emu, Biofreeze, Aleeve Relief with NSAIDs?:  No NSAIDs Taken Weakness: no Numbness: no Decreased grip strength: no Redness: no Swelling: no Bruising: no Fevers: no  Relevant past medical, surgical, family and social history reviewed and updated as indicated. Interim medical history since our last visit reviewed. Allergies and medications reviewed and updated.  Review of Systems  Constitutional: Negative for activity change, appetite change, diaphoresis, fatigue and fever.  Respiratory: Negative for cough, chest tightness and shortness of breath.   Cardiovascular: Negative for chest pain, palpitations and leg swelling.  Gastrointestinal: Negative.   Endocrine: Negative for cold intolerance and heat intolerance.  Musculoskeletal: Positive for arthralgias.  Neurological: Negative.   Psychiatric/Behavioral: Negative.     Per HPI unless specifically indicated above  Objective:    BP 138/81   Pulse 76   Temp 98.5 F (36.9 C) (Oral)   Ht  (1.727 m)   Wt 242 lb 3.2 oz (109.9 kg)   LMP  (LMP Unknown)    SpO2 99%   BMI 36.83 kg/m   Wt Readings from Last 3 Encounters:  09/06/19 242 lb 3.2 oz (109.9 kg)  11/12/18 243 lb (110.2 kg)  11/12/18 243 lb (110.2 kg)    Physical Exam Vitals and nursing note reviewed.  Constitutional:      General: She is awake. She is not in acute distress.    Appearance: She is well-developed. She is obese. She is not ill-appearing.  HENT:     Head: Normocephalic.     Right Ear: Hearing normal.     Left Ear: Hearing normal.  Eyes:     General: Lids are normal.        Right eye: No discharge.        Left eye: No discharge.     Conjunctiva/sclera: Conjunctivae normal.     Pupils: Pupils are equal, round, and reactive to light.  Neck:     Thyroid: No thyromegaly.     Vascular: No carotid bruit.  Cardiovascular:     Rate and Rhythm: Normal rate and regular rhythm.     Heart sounds: Normal heart sounds. No murmur. No gallop.   Pulmonary:     Effort: Pulmonary effort is normal. No accessory muscle usage or respiratory distress.     Breath sounds: Normal breath sounds.  Abdominal:     General: Bowel sounds are normal.     Palpations: Abdomen is soft.     Tenderness: There is no abdominal tenderness.  Musculoskeletal:     Cervical back: Normal range of motion and neck supple.     Right lower leg: No edema.     Left lower leg: No edema.  Skin:    General: Skin is warm and dry.  Neurological:     Mental Status: She is alert and oriented to person, place, and time.  Psychiatric:        Attention and Perception: Attention normal.        Mood and Affect: Mood normal.        Behavior: Behavior normal. Behavior is cooperative.        Thought Content: Thought content normal.        Judgment: Judgment normal.        Shoulder: bilateral    Inspection:  no swelling, ecchymosis, erythema or step off deformity.     Tenderness to Palpation:    Acromion: no    AC joint:no    Clavicle: no    Bicipital groove: no    Scapular spine: no    Coracoid process:  no    Humeral head: no    Supraspinatus tendon: no     Range of Motion:  Full Range of Motion bilaterally    Abduction:Normal    Adduction: Normal    Flexion: Normal    Extension: Normal    Internal rotation: Normal    External rotation: Normal    Painful arc: no     Muscle Strength: 5/5 bilaterally       Neuro: Sensation WNL.     Special Tests:     Neer sign: Negative    Hawkins sign: Negative    Cross arm adduction: Negative   Results for orders placed or performed in visit on 11/12/18  Comprehensive metabolic panel  Result Value Ref Range   Glucose 109 (H) 65 - 99 mg/dL   BUN 13 8 - 27 mg/dL   Creatinine, Ser 1.04 (H) 0.57 - 1.00 mg/dL   GFR calc non Af Amer 55 (L) >59 mL/min/1.73   GFR calc Af Amer 63 >59 mL/min/1.73   BUN/Creatinine Ratio 13 12 - 28   Sodium 139 134 - 144 mmol/L   Potassium 4.1 3.5 - 5.2 mmol/L   Chloride 102 96 - 106 mmol/L   CO2 22 20 - 29 mmol/L   Calcium 9.7 8.7 - 10.3 mg/dL   Total Protein 7.4 6.0 - 8.5 g/dL   Albumin 4.8 3.8 - 4.8 g/dL   Globulin, Total 2.6 1.5 - 4.5 g/dL   Albumin/Globulin Ratio 1.8 1.2 - 2.2   Bilirubin Total 0.5 0.0 - 1.2 mg/dL   Alkaline Phosphatase 69 39 - 117 IU/L   AST 23 0 - 40 IU/L   ALT 24 0 - 32 IU/L  CBC with Differential/Platelet  Result Value Ref Range   WBC 3.8 3.4 - 10.8 x10E3/uL   RBC 4.22 3.77 - 5.28 x10E6/uL   Hemoglobin 13.4 11.1 - 15.9 g/dL   Hematocrit 39.1 34.0 - 46.6 %   MCV 93 79 - 97 fL   MCH 31.8 26.6 - 33.0 pg   MCHC 34.3 31.5 - 35.7 g/dL   RDW 12.3 11.7 - 15.4 %   Platelets 179 150 - 450 x10E3/uL   Neutrophils 42 Not Estab. %   Lymphs 37 Not Estab. %   Monocytes 18 Not Estab. %   Eos 0 Not Estab. %   Basos 1 Not Estab. %   Neutrophils Absolute 1.6 1.4 - 7.0 x10E3/uL   Lymphocytes Absolute 1.4 0.7 - 3.1 x10E3/uL   Monocytes Absolute 0.7 0.1 - 0.9 x10E3/uL   EOS (ABSOLUTE) 0.0 0.0 - 0.4 x10E3/uL   Basophils Absolute 0.0 0.0 - 0.2 x10E3/uL   Immature Granulocytes 2 Not Estab. %    Immature Grans (Abs) 0.1 0.0 - 0.1 x10E3/uL  Lipid Panel w/o Chol/HDL Ratio  Result Value Ref Range   Cholesterol, Total 178 100 - 199 mg/dL   Triglycerides 133 0 - 149 mg/dL   HDL 58 >39 mg/dL   VLDL Cholesterol Cal 27 5 - 40 mg/dL   LDL Calculated 93 0 - 99 mg/dL  Thyroid Panel With TSH  Result Value Ref Range   TSH 2.870 0.450 - 4.500 uIU/mL   T4, Total 6.8 4.5 - 12.0 ug/dL   T3 Uptake Ratio 26 24 - 39 %   Free Thyroxine Index 1.8 1.2 - 4.9  Vitamin D (25 hydroxy)  Result Value Ref Range   Vit D, 25-Hydroxy 46.8 30.0 - 100.0 ng/mL  Magnesium  Result Value Ref Range   Magnesium 2.2 1.6 - 2.3 mg/dL  Hepatitis C antibody  Result Value Ref Range   Hep C Virus Ab <0.1 0.0 - 0.9 s/co ratio      Assessment & Plan:   Problem List Items Addressed This Visit      Cardiovascular and Mediastinum   Hypertension - Primary    Chronic, stable without medication.  Continue current diet focus and recommend continue to check BP at home daily.  BP below goal for age today and on home BP.        Endocrine   Hypothyroid    Chronic, stable.  Recheck thyroid panel today.  Adjust Levothyroxine dose as needed.      Relevant  Orders   Thyroid Panel With TSH     Genitourinary   CKD (chronic kidney disease) stage 3, GFR 30-59 ml/min    Chronic, stable.  Continue to monitor on labs and discussed possibly starting a low dose of Lisinopril or Losartan for kidney protection and to assist in maintaining BP.  She wishes to think about this and continue to monitor function.  CMP today.      Relevant Orders   Comprehensive metabolic panel     Other   Hyperlipidemia    Chronic, ongoing.  Continue Simvastatin.  Lipid panel next visit.      Obesity    Recommend continued focus on health diet choices and regular physical activity (30 minutes 5 days a week).       Right shoulder pain    Intermittent, suspect rotator cuff disorder.  Normal ROM without discomfort today.  She wishes to defer  imaging and PT at this time.  Wishes to try Voltaren gel and Tylenol as needed at home + work on her PT exercises.  Return to worsening or continued discomfort.          Follow up plan: Return in about 3 months (around 12/05/2019) for HTN/HLD, CKD, Thyroid.

## 2019-09-06 NOTE — Assessment & Plan Note (Signed)
Recommend continued focus on health diet choices and regular physical activity (30 minutes 5 days a week). 

## 2019-09-07 LAB — THYROID PANEL WITH TSH
Free Thyroxine Index: 1.7 (ref 1.2–4.9)
T3 Uptake Ratio: 25 % (ref 24–39)
T4, Total: 6.9 ug/dL (ref 4.5–12.0)
TSH: 1.56 u[IU]/mL (ref 0.450–4.500)

## 2019-09-07 LAB — COMPREHENSIVE METABOLIC PANEL
ALT: 21 IU/L (ref 0–32)
AST: 23 IU/L (ref 0–40)
Albumin/Globulin Ratio: 1.6 (ref 1.2–2.2)
Albumin: 4.7 g/dL (ref 3.7–4.7)
Alkaline Phosphatase: 73 IU/L (ref 39–117)
BUN/Creatinine Ratio: 16 (ref 12–28)
BUN: 16 mg/dL (ref 8–27)
Bilirubin Total: 0.3 mg/dL (ref 0.0–1.2)
CO2: 23 mmol/L (ref 20–29)
Calcium: 9.8 mg/dL (ref 8.7–10.3)
Chloride: 102 mmol/L (ref 96–106)
Creatinine, Ser: 1.03 mg/dL — ABNORMAL HIGH (ref 0.57–1.00)
GFR calc Af Amer: 63 mL/min/{1.73_m2} (ref >59)
GFR calc non Af Amer: 55 mL/min/{1.73_m2} — ABNORMAL LOW (ref >59)
Globulin, Total: 3 g/dL (ref 1.5–4.5)
Glucose: 102 mg/dL — ABNORMAL HIGH (ref 65–99)
Potassium: 4.2 mmol/L (ref 3.5–5.2)
Sodium: 138 mmol/L (ref 134–144)
Total Protein: 7.7 g/dL (ref 6.0–8.5)

## 2019-09-09 ENCOUNTER — Other Ambulatory Visit: Payer: Self-pay | Admitting: Nurse Practitioner

## 2019-09-09 ENCOUNTER — Telehealth: Payer: Self-pay | Admitting: Nurse Practitioner

## 2019-09-09 MED ORDER — SIMVASTATIN 40 MG PO TABS: 40.0000 mg | ORAL_TABLET | Freq: Every day | ORAL | 3 refills | Status: DC

## 2019-09-09 MED ORDER — LEVOTHYROXINE SODIUM 50 MCG PO TABS: 50.0000 ug | ORAL_TABLET | Freq: Every day | ORAL | 3 refills | Status: DC

## 2019-09-09 NOTE — Telephone Encounter (Signed)
Pharmacy called and stated that they would like to know if medication levothyroxine (SYNTHROID, LEVOTHROID) 50 MCG tablet [[id number]] Changed to a different manufacture.They are currently out. could this be done today? Pt is out of medication.  Pharmacy would like a call back. Please advise

## 2019-09-09 NOTE — Telephone Encounter (Signed)
Please alert pharmacy provider gives verbal consent for this to be changed.  Thank you.

## 2019-09-09 NOTE — Telephone Encounter (Signed)
Form received this AM for order to change brands. Okay to change and Jolene's signature on form and faxed back.

## 2020-01-17 ENCOUNTER — Other Ambulatory Visit: Payer: Self-pay | Admitting: Nurse Practitioner

## 2020-01-17 NOTE — Telephone Encounter (Signed)
Requested Prescriptions  Pending Prescriptions Disp Refills  . levothyroxine (SYNTHROID) 50 MCG tablet [Pharmacy Med Name: Levothyroxine Sodium 50 MCG Oral Tablet] 90 tablet 0    Sig: Take 1 tablet by mouth once daily     Endocrinology:  Hypothyroid Agents Failed - 01/17/2020 11:43 AM      Failed - TSH needs to be rechecked within 3 months after an abnormal result. Refill until TSH is due.      Passed - TSH in normal range and within 360 days    TSH  Date Value Ref Range Status  09/06/2019 1.560 0.450 - 4.500 uIU/mL Final         Passed - Valid encounter within last 12 months    Recent Outpatient Visits          4 months ago Essential hypertension   [facility] [place], Dorie Rank, NP   1 year ago Essential hypertension   [facility] [place], [place] T, NP   2 years ago Disorder of rotator cuff, right   [facility] Gabriel Cirri, NP   3 years ago Hypothyroidism, unspecified type   [facility] Gabriel Cirri, NP   3 years ago Hypothyroidism, unspecified hypothyroidism type   [facility] Gabriel Cirri, NP      Future Appointments            In 1 month [place], Dorie Rank, NP [facility]

## 2020-01-30 ENCOUNTER — Ambulatory Visit (INDEPENDENT_AMBULATORY_CARE_PROVIDER_SITE_OTHER): Payer: Medicare Other

## 2020-01-30 DIAGNOSIS — Z1231 Encounter for screening mammogram for malignant neoplasm of breast: Secondary | ICD-10-CM | POA: Diagnosis not present

## 2020-01-30 DIAGNOSIS — Z0000 Encounter for general adult medical examination without abnormal findings: Secondary | ICD-10-CM | POA: Diagnosis not present

## 2020-01-30 NOTE — Progress Notes (Signed)
Subjective:   Wanda White is a 74 y.o. female who presents for Medicare Annual (Subsequent) preventive examination.  This visit is being conducted via phone call  - after an attmept to do on video chat - due to the COVID-19 pandemic. This patient has given me verbal consent via phone to conduct this visit, patient states they are participating from their home address. Some vital signs may be absent or patient reported.   Patient identification: identified by name, DOB, and current address.    Review of Systems:   Cardiac Risk Factors include: advanced age (>62men, >42 women)     Objective:     Vitals: LMP  (LMP Unknown)   There is no height or weight on file to calculate BMI.  Advanced Directives 11/12/2018 11/19/2017 10/18/2017 09/02/2016  Does Patient Have a Medical Advance Directive? No Yes No No  Type of Advance Directive - [place] - -  Does patient want to make changes to medical advance directive? No - Patient declined - - -  Would patient like information on creating a medical advance directive? - - No - Patient declined -    Tobacco Social History   Tobacco Use  Smoking Status Former Smoker  . Years: 20.00  . Quit date: 09/26/2010  . Years since quitting: 9.3  Smokeless Tobacco Never Used     Counseling given: Not Answered   Clinical Intake:  Pre-visit preparation completed: Yes  Pain : No/denies pain     Nutritional Risks: None Diabetes: No  How often do you need to have someone help you when you read instructions, pamphlets, or other written materials from your doctor or pharmacy?: 1 - Never  Interpreter Needed?: No  Information entered by :: Roza Creamer,LPN  Past Medical History:  Diagnosis Date  . Depression   . Hyperlipidemia   . Hypothyroid    Past Surgical History:  Procedure Laterality Date  . CATARACT EXTRACTION  2004  . JOINT REPLACEMENT  01/2016   left shoulder  . SHOULDER SURGERY    . THYROID SURGERY     gland removed  . TUBAL LIGATION     Family History  Problem Relation Age of Onset  . Cancer Mother 60       breast  . Thyroid disease Mother   . CAD Father   . Hypertension Father   . Heart attack Father   . Heart attack Maternal Grandfather   . Heart disease Paternal Grandmother   . Lung disease Paternal Grandfather   . Cancer Sister        breast  . Hypertension Sister    Social History   Socioeconomic History  . Marital status: Widowed    Spouse name: Not on file  . Number of children: Not on file  . Years of education: Not on file  . Highest education level: High school graduate  Occupational History  . Not on file  Tobacco Use  . Smoking status: Former Smoker    Years: 20.00    Quit date: 09/26/2010    Years since quitting: 9.3  . Smokeless tobacco: Never Used  Substance and Sexual Activity  . Alcohol use: Yes    Comment: occasionally/socially  . Drug use: No  . Sexual activity: Never  Other Topics Concern  . Not on file  Social History Narrative  . Not on file   Social Determinants of Health   Financial Resource Strain:   . Difficulty of Paying Living Expenses:  Food Insecurity:   . Worried About Programme researcher, broadcasting/film/video in the Last Year:   . Barista in the Last Year:   Transportation Needs:   . Freight forwarder (Medical):   Marland Kitchen Lack of Transportation (Non-Medical):   Physical Activity:   . Days of Exercise per Week:   . Minutes of Exercise per Session:   Stress:   . Feeling of Stress :   Social Connections:   . Frequency of Communication with Friends and Family:   . Frequency of Social Gatherings with Friends and Family:   . Attends Religious Services:   . Active Member of Clubs or Organizations:   . Attends Banker Meetings:   Marland Kitchen Marital Status:     Outpatient Encounter Medications as of 01/30/2020  Medication Sig  . aspirin EC 81 MG tablet Take 81 mg by mouth daily.  . [place]-Glucosamine-Vit D (OSTEO BI-FLEX ONE PER  DAY PO) Take 2 tablets by mouth daily.  . Calcium Carb-Cholecalciferol (CALCIUM + D3) 600-200 MG-UNIT TABS Take 600 mg by mouth 2 (two) times daily. Reported on 01/19/2016  . Cholecalciferol (VITAMIN D-3 PO) Take 1,000 Units by mouth daily. Reported on 01/19/2016  . levothyroxine (SYNTHROID) 50 MCG tablet Take 1 tablet by mouth once daily  . Magnesium 250 MG TABS Take 250 mg by mouth daily. Reported on 01/19/2016  . Multiple Vitamin (MULTIVITAMIN) tablet Take 1 tablet by mouth daily. Reported on 01/19/2016  . Omega-3 Fatty Acids (FISH OIL) 1000 MG CAPS Take 1,000 mg by mouth 2 (two) times daily. Reported on 01/19/2016  . simvastatin (ZOCOR) 40 MG tablet Take 1 tablet (40 mg total) by mouth at bedtime.  . vitamin C (ASCORBIC ACID) 500 MG tablet Take 500 mg by mouth daily. Reported on 01/19/2016  . vitamin E 400 UNIT capsule Take 400 Units by mouth daily. Reported on 01/19/2016  . [DISCONTINUED] fluticasone (FLONASE) 50 MCG/ACT nasal spray Place 2 sprays into both nostrils daily. (Patient not taking: Reported on 01/30/2020)   No facility-administered encounter medications on file as of 01/30/2020.    Activities of Daily Living In your present state of health, do you have any difficulty performing the following activities: 01/30/2020  Hearing? N  Comment no hearing aids  Vision? Y  Comment eyeglasses, Dr.Woodard  Difficulty concentrating or making decisions? N  Walking or climbing stairs? N  Dressing or bathing? N  Doing errands, shopping? N  Preparing Food and eating ? N  Using the Toilet? N  In the past six months, have you accidently leaked urine? N  Do you have problems with loss of bowel control? N  Managing your Medications? N  Managing your Finances? N  Housekeeping or managing your Housekeeping? N  Some recent data might be hidden    Patient Care Team: Marjie Skiff, NP as PCP - General (Nurse Practitioner)    Assessment:   This is a routine wellness examination for  Bleu.  Exercise Activities and Dietary recommendations Current Exercise Habits: Home exercise routine, Type of exercise: walking, Time (Minutes): 30, Frequency (Times/Week): 3, Weekly Exercise (Minutes/Week): 90, Intensity: Mild, Exercise limited by: None identified  Goals Addressed   None     Fall Risk: Fall Risk  01/30/2020 11/12/2018 10/18/2017 09/02/2016 01/19/2016  Falls in the past year? 0 0 No No No  Number falls in past yr: 0 0 - - -  Injury with Fall? 0 0 - - -    FALL RISK PREVENTION PERTAINING TO  THE HOME:  Any stairs in or around the home? Yes  steps going in home If so, are there any without handrails? Yes   Home free of loose throw rugs in walkways, pet beds, electrical cords, etc? Yes  Adequate lighting in your home to reduce risk of falls? Yes   ASSISTIVE DEVICES UTILIZED TO PREVENT FALLS:  Life alert? No  Use of a cane, walker or w/c? No  Grab bars in the bathroom? Yes  Shower chair or bench in shower? No  Elevated toilet seat or a handicapped toilet? Yes   DME ORDERS:  DME order needed?  No   TIMED UP AND GO:  Unable to perform    Depression Screen PHQ 2/9 Scores 01/30/2020 11/12/2018 10/18/2017 09/02/2016  PHQ - 2 Score 0 0 0 0  PHQ- 9 Score - - 1 -     Cognitive Function     6CIT Screen 11/12/2018 10/18/2017  What Year? 0 points 0 points  What month? 0 points 0 points  What time? 0 points 0 points  Count back from 20 0 points 0 points  Months in reverse 0 points 0 points  Repeat phrase 0 points 0 points  Total Score 0 0    Immunization History  Administered Date(s) Administered  . Fluad Quad(high Dose 65+) 07/12/2019  . Influenza, High Dose Seasonal PF 07/29/2018  . Influenza-Unspecified 11/21/2014, 08/22/2016  . PFIZER SARS-COV-2 Vaccination 11/13/2019, 12/04/2019  . Pneumococcal Conjugate-13 03/18/2014  . Pneumococcal Polysaccharide-23 04/02/2013  . Td 02/10/2004  . Tdap 07/28/2011    Qualifies for Shingles Vaccine? Yes  Zostavax  completed n/a. Due for Shingrix. Education has been provided regarding the importance of this vaccine. Pt has been advised to call insurance company to determine out of pocket expense. Advised may also receive vaccine at local pharmacy or Health Dept. Verbalized acceptance and understanding.  Tdap: up to date   Flu Vaccine: up to date   Pneumococcal Vaccine:  Up to date   Covid-19 Vaccine:  Completed vaccines  Screening Tests Health Maintenance  Topic Date Due  . MAMMOGRAM  03/14/2014  . INFLUENZA VACCINE  04/26/2020  . TETANUS/TDAP  07/27/2021  . COLONOSCOPY  03/05/2022  . DEXA SCAN  Completed  . COVID-19 Vaccine  Completed  . Hepatitis C Screening  Completed  . PNA vac Low Risk Adult  Completed    Cancer Screenings:  Colorectal Screening: Completed 02/2012. Repeat every 10 years  Mammogram: ordered  Bone Density: Completed 2016.  Lung Cancer Screening: (Low Dose CT Chest recommended if Age 37-80 years, 30 pack-year currently smoking OR have quit w/in 15years.) does not qualify.     Additional Screening:  Hepatitis C Screening: does qualify; Completed 11/12/2018  Vision Screening: Recommended annual ophthalmology exams for early detection of glaucoma and other disorders of the eye. Is the patient up to date with their annual eye exam?  Yes  Who is the provider or what is the name of the office in which the pt attends annual eye exams?  Dr.Woodard annually   Dental Screening: Recommended annual dental exams for proper oral hygiene  Community Resource Referral:  CRR required this visit?  No       Plan:  I have personally reviewed and addressed the Medicare Annual Wellness questionnaire and have noted the following in the patient's chart:  A. Medical and social history B. Use of alcohol, tobacco or illicit drugs  C. Current medications and supplements D. Functional ability and status E.  Nutritional status  F.  Physical activity G. Advance directives H. List of  other physicians I.  Hospitalizations, surgeries, and ER visits in previous 12 months J.  Vitals K. Screenings such as hearing and vision if needed, cognitive and depression L. Referrals and appointments   In addition, I have reviewed and discussed with patient certain preventive protocols, quality metrics, and best practice recommendations. A written personalized care plan for preventive services as well as general preventive health recommendations were provided to patient.  Signed,    Collene Schlichter, LPN  06/03/9210 Nurse Health Advisor   Nurse Notes: none

## 2020-01-30 NOTE — Patient Instructions (Signed)
Wanda White , Thank you for taking time to come for your Medicare Wellness Visit. I appreciate your ongoing commitment to your health goals. Please review the following plan we discussed and let me know if I can assist you in the future.   Screening recommendations/referrals: Colonoscopy: done 2013  Mammogram: Please call [phone] to schedule your mammogram.  Bone Density: completed 2016 Recommended yearly ophthalmology/optometry visit for glaucoma screening and checkup Recommended yearly dental visit for hygiene and checkup  Vaccinations: Influenza vaccine: up to date  Pneumococcal vaccine: up to date  Tdap vaccine: up to date  Shingles vaccine: shingrix eligible    Covid-19: completed   Advanced directives: Advance directive discussed with you today. Once this is complete please bring a copy in to our office so we can scan it into your chart.  Conditions/risks identified: none   Next appointment: Follow up in one year for your annual wellness visit.    Preventive Care 31 Years and Older, Female Preventive care refers to lifestyle choices and visits with your health care provider that can promote health and wellness. What does preventive care include?  A yearly physical exam. This is also called an annual well check.  Dental exams once or twice a year.  Routine eye exams. Ask your health care provider how often you should have your eyes checked.  Personal lifestyle choices, including:  Daily care of your teeth and gums.  Regular physical activity.  Eating a healthy diet.  Avoiding tobacco and drug use.  Limiting alcohol use.  Practicing safe sex.  Taking low-dose aspirin every day.  Taking vitamin and mineral supplements as recommended by your health care provider. What happens during an annual well check? The services and screenings done by your health care provider during your annual well check will depend on your age, overall health, lifestyle risk factors,  and family history of disease. Counseling  Your health care provider may ask you questions about your:  Alcohol use.  Tobacco use.  Drug use.  Emotional well-being.  Home and relationship well-being.  Sexual activity.  Eating habits.  History of falls.  Memory and ability to understand (cognition).  Work and work Astronomer.  Reproductive health. Screening  You may have the following tests or measurements:  Height, weight, and BMI.  Blood pressure.  Lipid and cholesterol levels. These may be checked every 5 years, or more frequently if you are over 47 years old.  Skin check.  Lung cancer screening. You may have this screening every year starting at age 51 if you have a 30-pack-year history of smoking and currently smoke or have quit within the past 15 years.  Fecal occult blood test (FOBT) of the stool. You may have this test every year starting at age 5.  Flexible sigmoidoscopy or colonoscopy. You may have a sigmoidoscopy every 5 years or a colonoscopy every 10 years starting at age 48.  Hepatitis C blood test.  Hepatitis B blood test.  Sexually transmitted disease (STD) testing.  Diabetes screening. This is done by checking your blood sugar (glucose) after you have not eaten for a while (fasting). You may have this done every 1-3 years.  Bone density scan. This is done to screen for osteoporosis. You may have this done starting at age 68.  Mammogram. This may be done every 1-2 years. Talk to your health care provider about how often you should have regular mammograms. Talk with your health care provider about your test results, treatment options, and if necessary,  the need for more tests. Vaccines  Your health care provider may recommend certain vaccines, such as:  Influenza vaccine. This is recommended every year.  Tetanus, diphtheria, and acellular pertussis (Tdap, Td) vaccine. You may need a Td booster every 10 years.  Zoster vaccine. You may need  this after age 68.  Pneumococcal 13-valent conjugate (PCV13) vaccine. One dose is recommended after age 93.  Pneumococcal polysaccharide (PPSV23) vaccine. One dose is recommended after age 46. Talk to your health care provider about which screenings and vaccines you need and how often you need them. This information is not intended to replace advice given to you by your health care provider. Make sure you discuss any questions you have with your health care provider. Document Released: 10/09/2015 Document Revised: 06/01/2016 Document Reviewed: 07/14/2015 Elsevier Interactive Patient Education  2017 [place] Prevention in the Home Falls can cause injuries. They can happen to people of all ages. There are many things you can do to make your home safe and to help prevent falls. What can I do on the outside of my home?  Regularly fix the edges of walkways and driveways and fix any cracks.  Remove anything that might make you trip as you walk through a door, such as a raised step or threshold.  Trim any bushes or trees on the path to your home.  Use bright outdoor lighting.  Clear any walking paths of anything that might make someone trip, such as rocks or tools.  Regularly check to see if handrails are loose or broken. Make sure that both sides of any steps have handrails.  Any raised decks and porches should have guardrails on the edges.  Have any leaves, snow, or ice cleared regularly.  Use sand or salt on walking paths during winter.  Clean up any spills in your garage right away. This includes oil or grease spills. What can I do in the bathroom?  Use night lights.  Install grab bars by the toilet and in the tub and shower. Do not use towel bars as grab bars.  Use non-skid mats or decals in the tub or shower.  If you need to sit down in the shower, use a plastic, non-slip stool.  Keep the floor dry. Clean up any water that spills on the floor as soon as it  happens.  Remove soap buildup in the tub or shower regularly.  Attach bath mats securely with double-sided non-slip rug tape.  Do not have throw rugs and other things on the floor that can make you trip. What can I do in the bedroom?  Use night lights.  Make sure that you have a light by your bed that is easy to reach.  Do not use any sheets or blankets that are too big for your bed. They should not hang down onto the floor.  Have a firm chair that has side arms. You can use this for support while you get dressed.  Do not have throw rugs and other things on the floor that can make you trip. What can I do in the kitchen?  Clean up any spills right away.  Avoid walking on wet floors.  Keep items that you use a lot in easy-to-reach places.  If you need to reach something above you, use a strong step stool that has a grab bar.  Keep electrical cords out of the way.  Do not use floor polish or wax that makes floors slippery. If you must use  wax, use non-skid floor wax.  Do not have throw rugs and other things on the floor that can make you trip. What can I do with my stairs?  Do not leave any items on the stairs.  Make sure that there are handrails on both sides of the stairs and use them. Fix handrails that are broken or loose. Make sure that handrails are as long as the stairways.  Check any carpeting to make sure that it is firmly attached to the stairs. Fix any carpet that is loose or worn.  Avoid having throw rugs at the top or bottom of the stairs. If you do have throw rugs, attach them to the floor with carpet tape.  Make sure that you have a light switch at the top of the stairs and the bottom of the stairs. If you do not have them, ask someone to add them for you. What else can I do to help prevent falls?  Wear shoes that:  Do not have high heels.  Have rubber bottoms.  Are comfortable and fit you well.  Are closed at the toe. Do not wear sandals.  If you  use a stepladder:  Make sure that it is fully opened. Do not climb a closed stepladder.  Make sure that both sides of the stepladder are locked into place.  Ask someone to hold it for you, if possible.  Clearly mark and make sure that you can see:  Any grab bars or handrails.  First and last steps.  Where the edge of each step is.  Use tools that help you move around (mobility aids) if they are needed. These include:  Canes.  Walkers.  Scooters.  Crutches.  Turn on the lights when you go into a dark area. Replace any light bulbs as soon as they burn out.  Set up your furniture so you have a clear path. Avoid moving your furniture around.  If any of your floors are uneven, fix them.  If there are any pets around you, be aware of where they are.  Review your medicines with your doctor. Some medicines can make you feel dizzy. This can increase your chance of falling. Ask your doctor what other things that you can do to help prevent falls. This information is not intended to replace advice given to you by your health care provider. Make sure you discuss any questions you have with your health care provider. Document Released: 07/09/2009 Document Revised: 02/18/2016 Document Reviewed: 10/17/2014 Elsevier Interactive Patient Education  2017 [employer].

## 2020-03-06 ENCOUNTER — Ambulatory Visit: Payer: Medicare Other | Admitting: Nurse Practitioner

## 2020-04-02 ENCOUNTER — Ambulatory Visit: Payer: Medicare Other | Admitting: Nurse Practitioner

## 2020-06-02 ENCOUNTER — Ambulatory Visit: Payer: Medicare Other | Admitting: Nurse Practitioner

## 2020-06-18 ENCOUNTER — Ambulatory Visit: Payer: Medicare Other | Admitting: Nurse Practitioner

## 2020-06-25 ENCOUNTER — Ambulatory Visit: Payer: Medicare Other | Admitting: Nurse Practitioner

## 2020-07-10 ENCOUNTER — Encounter: Payer: Self-pay | Admitting: Nurse Practitioner

## 2020-07-17 ENCOUNTER — Ambulatory Visit: Payer: Medicare Other | Admitting: Nurse Practitioner

## 2020-07-28 ENCOUNTER — Encounter: Payer: Self-pay | Admitting: Nurse Practitioner

## 2020-07-28 ENCOUNTER — Ambulatory Visit (INDEPENDENT_AMBULATORY_CARE_PROVIDER_SITE_OTHER): Payer: Medicare Other | Admitting: Nurse Practitioner

## 2020-07-28 ENCOUNTER — Other Ambulatory Visit: Payer: Self-pay

## 2020-07-28 VITALS — BP 128/80 | HR 75 | Temp 98.3°F | Ht 68.0 in | Wt 250.2 lb

## 2020-07-28 DIAGNOSIS — E782 Mixed hyperlipidemia: Secondary | ICD-10-CM

## 2020-07-28 DIAGNOSIS — N1831 Chronic kidney disease, stage 3a: Secondary | ICD-10-CM | POA: Diagnosis not present

## 2020-07-28 DIAGNOSIS — R7301 Impaired fasting glucose: Secondary | ICD-10-CM

## 2020-07-28 DIAGNOSIS — I10 Essential (primary) hypertension: Secondary | ICD-10-CM

## 2020-07-28 DIAGNOSIS — E559 Vitamin D deficiency, unspecified: Secondary | ICD-10-CM | POA: Insufficient documentation

## 2020-07-28 DIAGNOSIS — R7309 Other abnormal glucose: Secondary | ICD-10-CM | POA: Insufficient documentation

## 2020-07-28 DIAGNOSIS — E039 Hypothyroidism, unspecified: Secondary | ICD-10-CM

## 2020-07-28 NOTE — Patient Instructions (Addendum)
Kegel exercises  [facility] at [facility]  Address: [address] [place], [place], [place] [address]  Phone: [phone]  DASH Eating Plan DASH stands for "Dietary Approaches to Stop Hypertension." The DASH eating plan is a healthy eating plan that has been shown to reduce high blood pressure (hypertension). It may also reduce your risk for type 2 diabetes, heart disease, and stroke. The DASH eating plan may also help with weight loss. What are tips for following this plan?  General guidelines  Avoid eating more than 2,300 mg (milligrams) of salt (sodium) a day. If you have hypertension, you may need to reduce your sodium intake to 1,500 mg a day.  Limit alcohol intake to no more than 1 drink a day for nonpregnant women and 2 drinks a day for men. One drink equals 12 oz of beer, 5 oz of wine, or 1 oz of hard liquor.  Work with your health care provider to maintain a healthy body weight or to lose weight. Ask what an ideal weight is for you.  Get at least 30 minutes of exercise that causes your heart to beat faster (aerobic exercise) most days of the week. Activities may include walking, swimming, or biking.  Work with your health care provider or diet and nutrition specialist (dietitian) to adjust your eating plan to your individual calorie needs. Reading food labels   Check food labels for the amount of sodium per serving. Choose foods with less than 5 percent of the Daily Value of sodium. Generally, foods with less than 300 mg of sodium per serving fit into this eating plan.  To find whole grains, look for the word "whole" as the first word in the ingredient list. Shopping  Buy products labeled as "low-sodium" or "no salt added."  Buy fresh foods. Avoid canned foods and premade or frozen meals. Cooking  Avoid adding salt when cooking. Use salt-free seasonings or herbs instead of table salt or sea salt. Check with your health care provider or pharmacist before  using salt substitutes.  Do not fry foods. Cook foods using healthy methods such as baking, boiling, grilling, and broiling instead.  Cook with heart-healthy oils, such as olive, canola, soybean, or sunflower oil. Meal planning  Eat a balanced diet that includes: ? 5 or more servings of fruits and vegetables each day. At each meal, try to fill half of your plate with fruits and vegetables. ? Up to 6-8 servings of whole grains each day. ? Less than 6 oz of lean meat, poultry, or fish each day. A 3-oz serving of meat is about the same size as a deck of cards. One egg equals 1 oz. ? 2 servings of low-fat dairy each day. ? A serving of nuts, seeds, or beans 5 times each week. ? Heart-healthy fats. Healthy fats called Omega-3 fatty acids are found in foods such as flaxseeds and coldwater fish, like sardines, salmon, and mackerel.  Limit how much you eat of the following: ? Canned or prepackaged foods. ? Food that is high in trans fat, such as fried foods. ? Food that is high in saturated fat, such as fatty meat. ? Sweets, desserts, sugary drinks, and other foods with added sugar. ? Full-fat dairy products.  Do not salt foods before eating.  Try to eat at least 2 vegetarian meals each week.  Eat more home-cooked food and less restaurant, buffet, and fast food.  When eating at a restaurant, ask that your food be prepared with less  salt or no salt, if possible. What foods are recommended? The items listed may not be a complete list. Talk with your dietitian about what dietary choices are best for you. Grains Whole-grain or whole-wheat bread. Whole-grain or whole-wheat pasta. Brown rice. [place]. Bulgur. Whole-grain and low-sodium cereals. Pita bread. Low-fat, low-sodium crackers. Whole-wheat flour tortillas. Vegetables Fresh or frozen vegetables (raw, steamed, roasted, or grilled). Low-sodium or reduced-sodium tomato and vegetable juice. Low-sodium or reduced-sodium tomato sauce and  tomato paste. Low-sodium or reduced-sodium canned vegetables. Fruits All fresh, dried, or frozen fruit. Canned fruit in natural juice (without added sugar). Meat and other protein foods Skinless chicken or [place]. Ground chicken or [place]. Pork with fat trimmed off. Fish and seafood. Egg whites. Dried beans, peas, or lentils. Unsalted nuts, nut butters, and seeds. Unsalted canned beans. Lean cuts of beef with fat trimmed off. Low-sodium, lean deli meat. Dairy Low-fat (1%) or fat-free (skim) milk. Fat-free, low-fat, or reduced-fat cheeses. Nonfat, low-sodium ricotta or cottage cheese. Low-fat or nonfat yogurt. Low-fat, low-sodium cheese. Fats and oils Soft margarine without trans fats. Vegetable oil. Low-fat, reduced-fat, or light mayonnaise and salad dressings (reduced-sodium). Canola, safflower, olive, soybean, and sunflower oils. Avocado. Seasoning and other foods Herbs. Spices. Seasoning mixes without salt. Unsalted popcorn and pretzels. Fat-free sweets. What foods are not recommended? The items listed may not be a complete list. Talk with your dietitian about what dietary choices are best for you. Grains Baked goods made with fat, such as croissants, muffins, or some breads. Dry pasta or rice meal packs. Vegetables Creamed or fried vegetables. Vegetables in a cheese sauce. Regular canned vegetables (not low-sodium or reduced-sodium). Regular canned tomato sauce and paste (not low-sodium or reduced-sodium). Regular tomato and vegetable juice (not low-sodium or reduced-sodium). Angie Fava. Olives. Fruits Canned fruit in a light or heavy syrup. Fried fruit. Fruit in cream or butter sauce. Meat and other protein foods Fatty cuts of meat. Ribs. Fried meat. Berniece Salines. Sausage. Bologna and other processed lunch meats. Salami. Fatback. Hotdogs. Bratwurst. Salted nuts and seeds. Canned beans with added salt. Canned or smoked fish. Whole eggs or egg yolks. Chicken or [place] with skin. Dairy Whole or 2%  milk, cream, and half-and-half. Whole or full-fat cream cheese. Whole-fat or sweetened yogurt. Full-fat cheese. Nondairy creamers. Whipped toppings. Processed cheese and cheese spreads. Fats and oils Butter. Stick margarine. Lard. Shortening. Ghee. Bacon fat. Tropical oils, such as coconut, palm kernel, or palm oil. Seasoning and other foods Salted popcorn and pretzels. Onion salt, garlic salt, seasoned salt, table salt, and sea salt. Worcestershire sauce. Tartar sauce. Barbecue sauce. Teriyaki sauce. Soy sauce, including reduced-sodium. Steak sauce. Canned and packaged gravies. Fish sauce. Oyster sauce. Cocktail sauce. Horseradish that you find on the shelf. Ketchup. Mustard. Meat flavorings and tenderizers. Bouillon cubes. Hot sauce and Tabasco sauce. Premade or packaged marinades. Premade or packaged taco seasonings. Relishes. Regular salad dressings. Where to find more information:  National Heart, Lung, and [place]: https://wilson-eaton.com/  American Heart Association: www.heart.org Summary  The DASH eating plan is a healthy eating plan that has been shown to reduce high blood pressure (hypertension). It may also reduce your risk for type 2 diabetes, heart disease, and stroke.  With the DASH eating plan, you should limit salt (sodium) intake to 2,300 mg a day. If you have hypertension, you may need to reduce your sodium intake to 1,500 mg a day.  When on the DASH eating plan, aim to eat more fresh fruits and vegetables, whole grains, lean proteins, low-fat  dairy, and heart-healthy fats.  Work with your health care provider or diet and nutrition specialist (dietitian) to adjust your eating plan to your individual calorie needs. This information is not intended to replace advice given to you by your health care provider. Make sure you discuss any questions you have with your health care provider. Document Revised: 08/25/2017 Document Reviewed: 09/05/2016 Elsevier Patient Education  2020  [employer].

## 2020-07-28 NOTE — Assessment & Plan Note (Signed)
With HTN, CKD, and HLD.  BMI 38.04 today.  Recommended eating smaller high protein, low fat meals more frequently and exercising 30 mins a day 5 times a week with a goal of 10-15lb weight loss in the next 3 months. Patient voiced their understanding and motivation to adhere to these recommendations.

## 2020-07-28 NOTE — Assessment & Plan Note (Signed)
Continues on daily supplement, check level today and adjust regimen as needed.

## 2020-07-28 NOTE — Assessment & Plan Note (Signed)
Chronic, stable without medication.  Continue current diet focus and recommend continue to check BP at home daily.  BP below goal for age today in office.  Will check BMP, TSH, CBC today.  Return in 6 months.

## 2020-07-28 NOTE — Assessment & Plan Note (Signed)
Chronic, stable.  Recheck thyroid panel today.  Adjust Levothyroxine dose as needed.

## 2020-07-28 NOTE — Assessment & Plan Note (Signed)
Noted over 89 on recent labs, will check A1C today and initiate medication as needed.

## 2020-07-28 NOTE — Assessment & Plan Note (Signed)
Chronic, ongoing. Continue Simvastatin and adjust as needed. Lipid panel today.  

## 2020-07-28 NOTE — Progress Notes (Signed)
BP 128/80   Pulse 75   Temp 98.3 F (36.8 C) (Oral)   Ht 5\' 8"  (1.727 m)   Wt 250 lb 3.2 oz (113.5 kg)   LMP  (LMP Unknown)   SpO2 99%   BMI 38.04 kg/m    Subjective:    Patient ID: Wanda White, female    DOB: 05/15/1946, 74 y.o.   MRN: [id number]  HPI: Wanda White is a 74 y.o. female  Chief Complaint  Patient presents with  . Hypertension   HYPERTENSION / HYPERLIPIDEMIA No current medications for BP, diet focus.  Continues on Simvastatin for HLD. Last LDL 93.   Satisfied with current treatment? no current medications for BP Duration of hypertension: years BP monitoring frequency: a few times a week BP range: 110-120/70 range Duration of hyperlipidemia: years Cholesterol medication side effects: no Cholesterol supplements: none Medication compliance: good compliance Aspirin: yes Recent stressors: no Recurrent headaches: no Visual changes: no Palpitations: no Dyspnea: no Chest pain: no Lower extremity edema: no Dizzy/lightheaded: no   HYPOTHYROIDISM Current Levothyroxine 50 MCG daily.  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 Last TSH 1.560 and T4 6.9. Thyroid control status:stable Satisfied with current treatment? yes Medication side effects: no Medication compliance: good compliance Etiology of hypothyroidism:  Recent dose adjustment:no Fatigue: no Cold intolerance: no Heat intolerance: no Weight gain: no Weight loss: no Constipation: no Diarrhea/loose stools: no Palpitations: no Lower extremity edema: no Anxiety/depressed mood: no  CHRONIC KIDNEY DISEASE With December 2020 -- CRT 1.03 and GFR 55. CKD status: stable Medications renally dose: yes Previous renal evaluation: no Pneumovax:  Up to Date Influenza Vaccine:  Up to Date   Relevant past medical, surgical, family and social history reviewed and updated as indicated. Interim medical history since our last visit reviewed. Allergies and medications reviewed and  updated.  Review of Systems  Constitutional: Negative for activity change, appetite change, diaphoresis, fatigue and fever.  Respiratory: Negative for cough, chest tightness and shortness of breath.   Cardiovascular: Negative for chest pain, palpitations and leg swelling.  Gastrointestinal: Negative.   Endocrine: Negative for cold intolerance and heat intolerance.  Neurological: Negative.   Psychiatric/Behavioral: Negative.     Per HPI unless specifically indicated above     Objective:    BP 128/80   Pulse 75   Temp 98.3 F (36.8 C) (Oral)   Ht 5\' 8"  (1.727 m)   Wt 250 lb 3.2 oz (113.5 kg)   LMP  (LMP Unknown)   SpO2 99%   BMI 38.04 kg/m   Wt Readings from Last 3 Encounters:  07/28/20 250 lb 3.2 oz (113.5 kg)  09/06/19 242 lb 3.2 oz (109.9 kg)  11/12/18 243 lb (110.2 kg)    Physical Exam Vitals and nursing note reviewed.  Constitutional:      General: She is awake. She is not in acute distress.    Appearance: She is well-developed. She is obese. She is not ill-appearing.  HENT:     Head: Normocephalic.     Right Ear: Hearing normal.     Left Ear: Hearing normal.  Eyes:     General: Lids are normal.        Right eye: No discharge.        Left eye: No discharge.     Conjunctiva/sclera: Conjunctivae normal.     Pupils: Pupils are equal, round, and reactive to light.  Neck:     Thyroid: No thyromegaly.  Vascular: No carotid bruit.  Cardiovascular:     Rate and Rhythm: Normal rate and regular rhythm.     Heart sounds: Normal heart sounds. No murmur heard.  No gallop.   Pulmonary:     Effort: Pulmonary effort is normal. No accessory muscle usage or respiratory distress.     Breath sounds: Normal breath sounds.  Abdominal:     General: Bowel sounds are normal.     Palpations: Abdomen is soft.     Tenderness: There is no abdominal tenderness.  Musculoskeletal:     Cervical back: Normal range of motion and neck supple.     Right lower leg: No edema.     Left  lower leg: No edema.  Skin:    General: Skin is warm and dry.  Neurological:     Mental Status: She is alert and oriented to person, place, and time.  Psychiatric:        Attention and Perception: Attention normal.        Mood and Affect: Mood normal.        Behavior: Behavior normal. Behavior is cooperative.        Thought Content: Thought content normal.        Judgment: Judgment normal.     Results for orders placed or performed in visit on 09/06/19  Comprehensive metabolic panel  Result Value Ref Range   Glucose 102 (H) 65 - 99 mg/dL   BUN 16 8 - 27 mg/dL   Creatinine, Ser 0.07 (H) 0.57 - 1.00 mg/dL   GFR calc non Af Amer 55 (L) >59 mL/min/1.73   GFR calc Af Amer 63 >59 mL/min/1.73   BUN/Creatinine Ratio 16 12 - 28   Sodium 138 134 - 144 mmol/L   Potassium 4.2 3.5 - 5.2 mmol/L   Chloride 102 96 - 106 mmol/L   CO2 23 20 - 29 mmol/L   Calcium 9.8 8.7 - 10.3 mg/dL   Total Protein 7.7 6.0 - 8.5 g/dL   Albumin 4.7 3.7 - 4.7 g/dL   Globulin, Total 3.0 1.5 - 4.5 g/dL   Albumin/Globulin Ratio 1.6 1.2 - 2.2   Bilirubin Total 0.3 0.0 - 1.2 mg/dL   Alkaline Phosphatase 73 39 - 117 IU/L   AST 23 0 - 40 IU/L   ALT 21 0 - 32 IU/L  Thyroid Panel With TSH  Result Value Ref Range   TSH 1.560 0.450 - 4.500 uIU/mL   T4, Total 6.9 4.5 - 12.0 ug/dL   T3 Uptake Ratio 25 24 - 39 %   Free Thyroxine Index 1.7 1.2 - 4.9      Assessment & Plan:   Problem List Items Addressed This Visit      Cardiovascular and Mediastinum   Hypertension    Chronic, stable without medication.  Continue current diet focus and recommend continue to check BP at home daily.  BP below goal for age today in office.  Will check BMP, TSH, CBC today.  Return in 6 months.      Relevant Orders   Basic metabolic panel     Endocrine   Hypothyroid    Chronic, stable.  Recheck thyroid panel today.  Adjust Levothyroxine dose as needed.      Relevant Orders   TSH   T4, free   IFG (impaired fasting glucose)     Noted over 100 on recent labs, will check A1C today and initiate medication as needed.      Relevant Orders   HgB  A1c     Genitourinary   CKD (chronic kidney disease) stage 3, GFR 30-59 ml/min (HCC) - Primary    Chronic, stable.  Continue to monitor on labs and discussed possibly starting a low dose of Lisinopril or Losartan for kidney protection and to assist in maintaining BP.  She wishes to think about this and continue to monitor function.  BMP today.      Relevant Orders   Basic metabolic panel   CBC with Differential/Platelet     Other   Hyperlipidemia    Chronic, ongoing.  Continue Simvastatin and adjust as needed.  Lipid panel today.      Relevant Orders   Lipid Panel w/o Chol/HDL Ratio   Morbid obesity (HCC)    With HTN, CKD, and HLD.  BMI 38.04 today.  Recommended eating smaller high protein, low fat meals more frequently and exercising 30 mins a day 5 times a week with a goal of 10-15lb weight loss in the next 3 months. Patient voiced their understanding and motivation to adhere to these recommendations.       Vitamin D deficiency    Continues on daily supplement, check level today and adjust regimen as needed.      Relevant Orders   VITAMIN D 25 Hydroxy (Vit-D Deficiency, Fractures)       Follow up plan: Return in about 6 months (around 01/25/2021) for Annual physical exam.

## 2020-07-28 NOTE — Assessment & Plan Note (Signed)
Chronic, stable.  Continue to monitor on labs and discussed possibly starting a low dose of Lisinopril or Losartan for kidney protection and to assist in maintaining BP.  She wishes to think about this and continue to monitor function.  BMP today.

## 2020-07-29 LAB — CBC WITH DIFFERENTIAL/PLATELET
Basophils Absolute: 0 10*3/uL (ref 0.0–0.2)
Basos: 0 %
EOS (ABSOLUTE): 0 10*3/uL (ref 0.0–0.4)
Eos: 1 %
Hematocrit: 39.6 % (ref 34.0–46.6)
Hemoglobin: 12.9 g/dL (ref 11.1–15.9)
Immature Grans (Abs): 0.1 10*3/uL (ref 0.0–0.1)
Immature Granulocytes: 1 %
Lymphocytes Absolute: 1.7 10*3/uL (ref 0.7–3.1)
Lymphs: 37 %
MCH: 30 pg (ref 26.6–33.0)
MCHC: 32.6 g/dL (ref 31.5–35.7)
MCV: 92 fL (ref 79–97)
Monocytes Absolute: 0.9 10*3/uL (ref 0.1–0.9)
Monocytes: 20 %
Neutrophils Absolute: 1.9 10*3/uL (ref 1.4–7.0)
Neutrophils: 41 %
Platelets: 144 10*3/uL — ABNORMAL LOW (ref 150–450)
RBC: 4.3 x10E6/uL (ref 3.77–5.28)
RDW: 12.4 % (ref 11.7–15.4)
WBC: 4.5 10*3/uL (ref 3.4–10.8)

## 2020-07-29 LAB — LIPID PANEL W/O CHOL/HDL RATIO
Cholesterol, Total: 181 mg/dL (ref 100–199)
HDL: 51 mg/dL (ref >39)
LDL Chol Calc (NIH): 106 mg/dL — ABNORMAL HIGH (ref 0–99)
Triglycerides: 133 mg/dL (ref 0–149)
VLDL Cholesterol Cal: 24 mg/dL (ref 5–40)

## 2020-07-29 LAB — BASIC METABOLIC PANEL
BUN/Creatinine Ratio: 15 (ref 12–28)
BUN: 14 mg/dL (ref 8–27)
CO2: 24 mmol/L (ref 20–29)
Calcium: 9.5 mg/dL (ref 8.7–10.3)
Chloride: 102 mmol/L (ref 96–106)
Creatinine, Ser: 0.95 mg/dL (ref 0.57–1.00)
GFR calc Af Amer: 69 mL/min/{1.73_m2} (ref >59)
GFR calc non Af Amer: 60 mL/min/{1.73_m2} (ref >59)
Glucose: 106 mg/dL — ABNORMAL HIGH (ref 65–99)
Potassium: 3.8 mmol/L (ref 3.5–5.2)
Sodium: 141 mmol/L (ref 134–144)

## 2020-07-29 LAB — HEMOGLOBIN A1C
Est. average glucose Bld gHb Est-mCnc: 126 mg/dL
Hgb A1c MFr Bld: 6 % — ABNORMAL HIGH (ref 4.8–5.6)

## 2020-07-29 LAB — TSH: TSH: 2.55 u[IU]/mL (ref 0.450–4.500)

## 2020-07-29 LAB — VITAMIN D 25 HYDROXY (VIT D DEFICIENCY, FRACTURES): Vit D, 25-Hydroxy: 61.4 ng/mL (ref 30.0–100.0)

## 2020-07-29 LAB — T4, FREE: Free T4: 1.22 ng/dL (ref 0.82–1.77)

## 2020-07-29 NOTE — Progress Notes (Signed)
Contacted via MyChart  Good afternoon Wanda White, your labs have returned: -  The A1C is the diabetes testing we talked about, this looks at your blood sugars over the past 3 months and turns the average into a number.  Your number is 6.0%, meaning you are prediabetic.  Any number 5.7 to 6.4 is considered prediabetes and any number 6.5 or greater is considered diabetes.   I would recommend heavy focus on decreasing foods high in sugar and your intake of things like bread products, pasta, and rice.  The American Diabetes Association online has a large amount of information on diet changes to make.  We will recheck this number in 3 months to ensure you are not continuing to trend upwards and move into diabetes.  Have a good day. - Thyroid testing normal, you can continue current Levothyroxine dose. - LDL (bad cholesterol) was elevated above goal.  Would like to see less then 70.  Continue Simvastatin daily. - Platelet count was a little low this check at 144, I will plan on rechecking this your next visit to ensure it does not trend down.  These are what help clot your blood.  Any questions? Keep being awesome!!  Thank you for allowing me to participate in your care. Kindest regards, Novak Stgermaine

## 2020-09-04 ENCOUNTER — Encounter: Payer: Medicare Other | Admitting: Nurse Practitioner

## 2020-09-22 ENCOUNTER — Other Ambulatory Visit: Payer: Self-pay | Admitting: Nurse Practitioner

## 2020-09-22 NOTE — Telephone Encounter (Signed)
Requested Prescriptions  Pending Prescriptions Disp Refills   simvastatin (ZOCOR) 40 MG tablet [Pharmacy Med Name: Simvastatin 40 MG Oral Tablet] 90 tablet 3    Sig: TAKE 1 TABLET BY MOUTH AT BEDTIME     Cardiovascular:  Antilipid - Statins Failed - 09/22/2020  9:53 AM      Failed - LDL in normal range and within 360 days    LDL Chol Calc (NIH)  Date Value Ref Range Status  07/28/2020 106 (H) 0 - 99 mg/dL Final         Passed - Total Cholesterol in normal range and within 360 days    Cholesterol, Total  Date Value Ref Range Status  07/28/2020 181 100 - 199 mg/dL Final         Passed - HDL in normal range and within 360 days    HDL  Date Value Ref Range Status  07/28/2020 51 >39 mg/dL Final         Passed - Triglycerides in normal range and within 360 days    Triglycerides  Date Value Ref Range Status  07/28/2020 133 0 - 149 mg/dL Final         Passed - Patient is not pregnant      Passed - Valid encounter within last 12 months    Recent Outpatient Visits          1 month ago Stage 3a chronic kidney disease (HCC)   [facility] Cannady, Dorie Rank, NP   1 year ago Essential hypertension   [facility] [place], [place] T, NP   1 year ago Essential hypertension   [facility] [place], [place] T, NP   2 years ago Disorder of rotator cuff, right   [facility] Gabriel Cirri, NP   4 years ago Hypothyroidism, unspecified type   [facility] Gabriel Cirri, NP      Future Appointments            In 4 months [facility], [facility]

## 2020-10-23 ENCOUNTER — Other Ambulatory Visit: Payer: Self-pay | Admitting: Nurse Practitioner

## 2021-01-26 ENCOUNTER — Telehealth: Payer: Self-pay | Admitting: Nurse Practitioner

## 2021-01-26 NOTE — Telephone Encounter (Signed)
Copied from CRM [id number]. Topic: Medicare AWV >> Jan 26, 2021  3:39 PM Geraldine Contras wrote: Reason for CRM:  01/26/21 LM re: AWVS rescheduled to do by phone on May 13 @ 10:30 am-srs

## 2021-02-03 ENCOUNTER — Ambulatory Visit: Payer: Medicare Other

## 2021-02-05 ENCOUNTER — Ambulatory Visit: Payer: Medicare Other

## 2021-04-05 ENCOUNTER — Ambulatory Visit: Payer: Medicare Other

## 2021-05-12 ENCOUNTER — Other Ambulatory Visit: Payer: Self-pay | Admitting: Nurse Practitioner

## 2021-05-12 NOTE — Telephone Encounter (Signed)
Requested Prescriptions  Pending Prescriptions Disp Refills  . EUTHYROX 50 MCG tablet [Pharmacy Med Name: Euthyrox 50 MCG Oral Tablet] 90 tablet 0    Sig: Take 1 tablet by mouth once daily     Endocrinology:  Hypothyroid Agents Failed - 05/12/2021  3:38 PM      Failed - TSH needs to be rechecked within 3 months after an abnormal result. Refill until TSH is due.      Passed - TSH in normal range and within 360 days    TSH  Date Value Ref Range Status  07/28/2020 2.550 0.450 - 4.500 uIU/mL Final         Passed - Valid encounter within last 12 months    Recent Outpatient Visits          9 months ago Stage 3a chronic kidney disease (HCC)   [facility] Cannady, Dorie Rank, NP   1 year ago Essential hypertension   [facility] [place], L[place] T, NP   2 years ago Essential hypertension   [facility] [place], [place] T, NP   3 years ago Disorder of rotator cuff, right   [facility] Gabriel Cirri, NP   4 years ago Hypothyroidism, unspecified type   [facility] Gabriel Cirri, NP      Future Appointments            In 3 weeks  [facility], [facility]   In 1 month Cannady, Dorie Rank, NP [facility], [facility]

## 2021-05-14 ENCOUNTER — Ambulatory Visit: Payer: Medicare Other

## 2021-06-04 ENCOUNTER — Ambulatory Visit (INDEPENDENT_AMBULATORY_CARE_PROVIDER_SITE_OTHER): Payer: Medicare Other

## 2021-06-04 VITALS — Ht 68.0 in | Wt 249.0 lb

## 2021-06-04 DIAGNOSIS — Z0000 Encounter for general adult medical examination without abnormal findings: Secondary | ICD-10-CM

## 2021-06-04 NOTE — Progress Notes (Signed)
I connected with Wanda White today by telephone and verified that I am speaking with the correct person using two identifiers. Location patient: home Location provider: work Persons participating in the virtual visit: Rebbeca PaulKathy White, Wanda Gauthreaux LPN.   I discussed the limitations, risks, security and privacy concerns of performing an evaluation and management service by telephone and the availability of in person appointments. I also discussed with the patient that there may be a patient responsible charge related to this service. The patient expressed understanding and verbally consented to this telephonic visit.    Interactive audio and video telecommunications were attempted between this provider and patient, however failed, due to patient having technical difficulties OR patient did not have access to video capability.  We continued and completed visit with audio only.     Vital signs may be patient reported or missing.  Subjective:   Wanda White is a 75 y.o. female who presents for Medicare Annual (Subsequent) preventive examination.  Review of Systems     Cardiac Risk Factors include: advanced age (>5755men, 19>65 women);dyslipidemia;hypertension;obesity (BMI >30kg/m2)     Objective:    Today's Vitals   06/04/21 0941  Weight: 249 lb (112.9 kg)  Height: 5\' 8"  (1.727 m)   Body mass index is 37.86 kg/m.  Advanced Directives 06/04/2021 11/12/2018 11/19/2017 10/18/2017 09/02/2016  Does Patient Have a Medical Advance Directive? No No Yes No No  Type of Advance Directive - - Healthcare Power of Attorney - -  Does patient want to make changes to medical advance directive? - No - Patient declined - - -  Would patient like information on creating a medical advance directive? - - - No - Patient declined -    Current Medications (verified) Outpatient Encounter Medications as of 06/04/2021  Medication Sig   aspirin EC 81 MG tablet Take 81 mg by mouth daily.   Boswellia-Glucosamine-Vit D  (OSTEO BI-FLEX ONE PER DAY PO) Take 2 tablets by mouth daily.   Calcium Carb-Cholecalciferol (CALCIUM + D3) 600-200 MG-UNIT TABS Take 600 mg by mouth 2 (two) times daily. Reported on 01/19/2016   Cholecalciferol (VITAMIN D-3 PO) Take 1,000 Units by mouth daily. Reported on 01/19/2016   EUTHYROX 50 MCG tablet Take 1 tablet by mouth once daily   Magnesium 250 MG TABS Take 250 mg by mouth daily. Reported on 01/19/2016   Multiple Vitamin (MULTIVITAMIN) tablet Take 1 tablet by mouth daily. Reported on 01/19/2016   Omega-3 Fatty Acids (FISH OIL) 1000 MG CAPS Take 1,000 mg by mouth 2 (two) times daily. Reported on 01/19/2016   simvastatin (ZOCOR) 40 MG tablet TAKE 1 TABLET BY MOUTH AT BEDTIME   vitamin C (ASCORBIC ACID) 500 MG tablet Take 500 mg by mouth daily. Reported on 01/19/2016   vitamin E 400 UNIT capsule Take 400 Units by mouth daily. Reported on 01/19/2016   No facility-administered encounter medications on file as of 06/04/2021.    Allergies (verified) Amoxicillin   History: Past Medical History:  Diagnosis Date   Depression    Hyperlipidemia    Hypothyroid    Past Surgical History:  Procedure Laterality Date   CATARACT EXTRACTION  2004   JOINT REPLACEMENT  01/2016   left shoulder   SHOULDER SURGERY     THYROID SURGERY     gland removed   TUBAL LIGATION     Family History  Problem Relation Age of Onset   Cancer Mother [id number]       breast   Thyroid disease Mother  CAD Father    Hypertension Father    Heart attack Father    Heart attack Maternal Grandfather    Heart disease Paternal Grandmother    Lung disease Paternal Grandfather    Cancer Sister        breast   Hypertension Sister    Social History   Socioeconomic History   Marital status: Widowed    Spouse name: Not on file   Number of children: Not on file   Years of education: Not on file   Highest education level: High school graduate  Occupational History   Not on file  Tobacco Use   Smoking status: Former     Years: 20.00    Types: Cigarettes    Quit date: 09/26/2010    Years since quitting: 10.6   Smokeless tobacco: Never  Vaping Use   Vaping Use: Never used  Substance and Sexual Activity   Alcohol use: Yes    Comment: occasionally/socially   Drug use: No   Sexual activity: Not Currently  Other Topics Concern   Not on file  Social History Narrative   Not on file   Social Determinants of Health   Financial Resource Strain: Low Risk    Difficulty of Paying Living Expenses: Not hard at all  Food Insecurity: No Food Insecurity   Worried About Programme researcher, broadcasting/film/video in the Last Year: Never true   Ran Out of Food in the Last Year: Never true  Transportation Needs: No Transportation Needs   Lack of Transportation (Medical): No   Lack of Transportation (Non-Medical): No  Physical Activity: Insufficiently Active   Days of Exercise per Week: 4 days   Minutes of Exercise per Session: 30 min  Stress: No Stress Concern Present   Feeling of Stress : Only a little  Social Connections: Not on file    Tobacco Counseling Counseling given: Not Answered   Clinical Intake:  Pre-visit preparation completed: Yes  Pain : No/denies pain     Nutritional Status: BMI > 30  Obese Nutritional Risks: None Diabetes: No  How often do you need to have someone help you when you read instructions, pamphlets, or other written materials from your doctor or pharmacy?: 1 - Never What is the last grade level you completed in school?: 12th grade  Diabetic? no  Interpreter Needed?: No  Information entered by :: NAllen LPN   Activities of Daily Living In your present state of health, do you have any difficulty performing the following activities: 06/04/2021  Hearing? N  Vision? N  Difficulty concentrating or making decisions? N  Walking or climbing stairs? Y  Comment due to knees  Dressing or bathing? N  Doing errands, shopping? N  Preparing Food and eating ? N  Using the Toilet? N  In the past six  months, have you accidently leaked urine? N  Do you have problems with loss of bowel control? N  Managing your Medications? N  Managing your Finances? N  Housekeeping or managing your Housekeeping? N  Some recent data might be hidden    Patient Care Team: Marjie Skiff, NP as PCP - General (Nurse Practitioner)  Indicate any recent Medical Services you may have received from other than [facility] providers in the past year (date may be approximate).     Assessment:   This is a routine wellness examination for [place].  Hearing/Vision screen Vision Screening - Comments:: Regular eye exams, [facility]  Dietary issues and exercise activities discussed: Current  Exercise Habits: Home exercise routine, Type of exercise: walking, Time (Minutes): 30, Frequency (Times/Week): 4, Weekly Exercise (Minutes/Week): 120   Goals Addressed             This Visit's Progress    Patient Stated       06/04/2021, wants to weigh 230 pounds       Depression Screen PHQ 2/9 Scores 06/04/2021 01/30/2020 11/12/2018 10/18/2017 09/02/2016 01/19/2016  PHQ - 2 Score 0 0 0 0 0 0  PHQ- 9 Score - - - 1 - -    Fall Risk Fall Risk  06/04/2021 01/30/2020 11/12/2018 10/18/2017 09/02/2016  Falls in the past year? 0 0 0 No No  Number falls in past yr: - 0 0 - -  Injury with Fall? - 0 0 - -  Risk for fall due to : Medication side effect - - - -  Follow up Falls evaluation completed;Education provided;Falls prevention discussed - - - -    FALL RISK PREVENTION PERTAINING TO THE HOME:  Any stairs in or around the home? Yes  If so, are there any without handrails? No  Home free of loose throw rugs in walkways, pet beds, electrical cords, etc? Yes  Adequate lighting in your home to reduce risk of falls? Yes   ASSISTIVE DEVICES UTILIZED TO PREVENT FALLS:  Life alert? No  Use of a cane, walker or w/c? No  Grab bars in the bathroom? Yes  Shower chair or bench in shower? No  Elevated toilet seat or a handicapped toilet?  Yes   TIMED UP AND GO:  Was the test performed? No .      Cognitive Function:     6CIT Screen 06/04/2021 11/12/2018 10/18/2017  What Year? 0 points 0 points 0 points  What month? 0 points 0 points 0 points  What time? 0 points 0 points 0 points  Count back from 20 0 points 0 points 0 points  Months in reverse 2 points 0 points 0 points  Repeat phrase 0 points 0 points 0 points  Total Score 2 0 0    Immunizations Immunization History  Administered Date(s) Administered   Fluad Quad(high Dose 65+) 07/12/2019   Influenza, High Dose Seasonal PF 07/29/2018, 07/23/2020   Influenza-Unspecified 11/21/2014, 08/22/2016, 07/23/2020   PFIZER(Purple Top)SARS-COV-2 Vaccination 11/13/2019, 12/04/2019, 07/23/2020   Pneumococcal Conjugate-13 03/18/2014   Pneumococcal Polysaccharide-23 04/02/2013   Td 02/10/2004   Tdap 07/28/2011    TDAP status: Up to date  Flu Vaccine status: Due, Education has been provided regarding the importance of this vaccine. Advised may receive this vaccine at local pharmacy or Health Dept. Aware to provide a copy of the vaccination record if obtained from local pharmacy or Health Dept. Verbalized acceptance and understanding.  Pneumococcal vaccine status: Up to date  Covid-19 vaccine status: Completed vaccines  Qualifies for Shingles Vaccine? Yes   Zostavax completed No   Shingrix Completed?: No.    Education has been provided regarding the importance of this vaccine. Patient has been advised to call insurance company to determine out of pocket expense if they have not yet received this vaccine. Advised may also receive vaccine at local pharmacy or Health Dept. Verbalized acceptance and understanding.  Screening Tests Health Maintenance  Topic Date Due   Zoster Vaccines- Shingrix (1 of 2) Never done   MAMMOGRAM  03/14/2014   COVID-19 Vaccine (4 - Booster for Pfizer series) 11/23/2020   INFLUENZA VACCINE  04/26/2021   TETANUS/TDAP  07/27/2021   COLONOSCOPY  (Pts  45-61yrs Insurance coverage will need to be confirmed)  03/05/2022   DEXA SCAN  Completed   Hepatitis C Screening  Completed   PNA vac Low Risk Adult  Completed   HPV VACCINES  Aged Out    Health Maintenance  Health Maintenance Due  Topic Date Due   Zoster Vaccines- Shingrix (1 of 2) Never done   MAMMOGRAM  03/14/2014   COVID-19 Vaccine (4 - Booster for Pfizer series) 11/23/2020   INFLUENZA VACCINE  04/26/2021    Colorectal cancer screening: Type of screening: Colonoscopy. Completed 03/05/2012. Repeat every 10 years  Mammogram status: due  Bone Density status: Completed 12/24/2014.   Lung Cancer Screening: (Low Dose CT Chest recommended if Age 33-80 years, 30 pack-year currently smoking OR have quit w/in 15years.) does not qualify.   Lung Cancer Screening Referral: no  Additional Screening:  Hepatitis C Screening: does qualify; Completed 11/12/2018  Vision Screening: Recommended annual ophthalmology exams for early detection of glaucoma and other disorders of the eye. Is the patient up to date with their annual eye exam?  Yes  Who is the provider or what is the name of the office in which the patient attends annual eye exams? [facility] If pt is not established with a provider, would they like to be referred to a provider to establish care? No .   Dental Screening: Recommended annual dental exams for proper oral hygiene  Community Resource Referral / Chronic Care Management: CRR required this visit?  No   CCM required this visit?  No      Plan:     I have personally reviewed and noted the following in the patient's chart:   Medical and social history Use of alcohol, tobacco or illicit drugs  Current medications and supplements including opioid prescriptions.  Functional ability and status Nutritional status Physical activity Advanced directives List of other physicians Hospitalizations, surgeries, and ER visits in previous 12  months Vitals Screenings to include cognitive, depression, and falls Referrals and appointments  In addition, I have reviewed and discussed with patient certain preventive protocols, quality metrics, and best practice recommendations. A written personalized care plan for preventive services as well as general preventive health recommendations were provided to patient.     Barb Merino, LPN   04/02/5884   Nurse Notes:

## 2021-06-04 NOTE — Patient Instructions (Signed)
Wanda White , Thank you for taking time to come for your Medicare Wellness Visit. I appreciate your ongoing commitment to your health goals. Please review the following plan we discussed and let me know if I can assist you in the future.   Screening recommendations/referrals: Colonoscopy: completed 03/05/2012 Mammogram: due Bone Density: completed 12/24/2014 Recommended yearly ophthalmology/optometry visit for glaucoma screening and checkup Recommended yearly dental visit for hygiene and checkup  Vaccinations: Influenza vaccine: due Pneumococcal vaccine: completed 03/18/2014 Tdap vaccine: completed 07/28/2011, due 07/27/2021 Shingles vaccine: discussed   Covid-19: 07/23/2020, 12/04/2019, 11/13/2019  Advanced directives: Advance directive discussed with you today.   Conditions/risks identified: none  Next appointment: Follow up in one year for your annual wellness visit    Preventive Care 65 Years and Older, Female Preventive care refers to lifestyle choices and visits with your health care provider that can promote health and wellness. What does preventive care include? A yearly physical exam. This is also called an annual well check. Dental exams once or twice a year. Routine eye exams. Ask your health care provider how often you should have your eyes checked. Personal lifestyle choices, including: Daily care of your teeth and gums. Regular physical activity. Eating a healthy diet. Avoiding tobacco and drug use. Limiting alcohol use. Practicing safe sex. Taking low-dose aspirin every day. Taking vitamin and mineral supplements as recommended by your health care provider. What happens during an annual well check? The services and screenings done by your health care provider during your annual well check will depend on your age, overall health, lifestyle risk factors, and family history of disease. Counseling  Your health care provider may ask you questions about your: Alcohol  use. Tobacco use. Drug use. Emotional well-being. Home and relationship well-being. Sexual activity. Eating habits. History of falls. Memory and ability to understand (cognition). Work and work Astronomer. Reproductive health. Screening  You may have the following tests or measurements: Height, weight, and BMI. Blood pressure. Lipid and cholesterol levels. These may be checked every 5 years, or more frequently if you are over 35 years old. Skin check. Lung cancer screening. You may have this screening every year starting at age 61 if you have a 30-pack-year history of smoking and currently smoke or have quit within the past 15 years. Fecal occult blood test (FOBT) of the stool. You may have this test every year starting at age 9. Flexible sigmoidoscopy or colonoscopy. You may have a sigmoidoscopy every 5 years or a colonoscopy every 10 years starting at age 1. Hepatitis C blood test. Hepatitis B blood test. Sexually transmitted disease (STD) testing. Diabetes screening. This is done by checking your blood sugar (glucose) after you have not eaten for a while (fasting). You may have this done every 1-3 years. Bone density scan. This is done to screen for osteoporosis. You may have this done starting at age 69. Mammogram. This may be done every 1-2 years. Talk to your health care provider about how often you should have regular mammograms. Talk with your health care provider about your test results, treatment options, and if necessary, the need for more tests. Vaccines  Your health care provider may recommend certain vaccines, such as: Influenza vaccine. This is recommended every year. Tetanus, diphtheria, and acellular pertussis (Tdap, Td) vaccine. You may need a Td booster every 10 years. Zoster vaccine. You may need this after age 38. Pneumococcal 13-valent conjugate (PCV13) vaccine. One dose is recommended after age 62. Pneumococcal polysaccharide (PPSV23) vaccine. One dose is  recommended after age 63. Talk to your health care provider about which screenings and vaccines you need and how often you need them. This information is not intended to replace advice given to you by your health care provider. Make sure you discuss any questions you have with your health care provider. Document Released: 10/09/2015 Document Revised: 06/01/2016 Document Reviewed: 07/14/2015 Elsevier Interactive Patient Education  2017 [place] Prevention in the Home Falls can cause injuries. They can happen to people of all ages. There are many things you can do to make your home safe and to help prevent falls. What can I do on the outside of my home? Regularly fix the edges of walkways and driveways and fix any cracks. Remove anything that might make you trip as you walk through a door, such as a raised step or threshold. Trim any bushes or trees on the path to your home. Use bright outdoor lighting. Clear any walking paths of anything that might make someone trip, such as rocks or tools. Regularly check to see if handrails are loose or broken. Make sure that both sides of any steps have handrails. Any raised decks and porches should have guardrails on the edges. Have any leaves, snow, or ice cleared regularly. Use sand or salt on walking paths during winter. Clean up any spills in your garage right away. This includes oil or grease spills. What can I do in the bathroom? Use night lights. Install grab bars by the toilet and in the tub and shower. Do not use towel bars as grab bars. Use non-skid mats or decals in the tub or shower. If you need to sit down in the shower, use a plastic, non-slip stool. Keep the floor dry. Clean up any water that spills on the floor as soon as it happens. Remove soap buildup in the tub or shower regularly. Attach bath mats securely with double-sided non-slip rug tape. Do not have throw rugs and other things on the floor that can make you  trip. What can I do in the bedroom? Use night lights. Make sure that you have a light by your bed that is easy to reach. Do not use any sheets or blankets that are too big for your bed. They should not hang down onto the floor. Have a firm chair that has side arms. You can use this for support while you get dressed. Do not have throw rugs and other things on the floor that can make you trip. What can I do in the kitchen? Clean up any spills right away. Avoid walking on wet floors. Keep items that you use a lot in easy-to-reach places. If you need to reach something above you, use a strong step stool that has a grab bar. Keep electrical cords out of the way. Do not use floor polish or wax that makes floors slippery. If you must use wax, use non-skid floor wax. Do not have throw rugs and other things on the floor that can make you trip. What can I do with my stairs? Do not leave any items on the stairs. Make sure that there are handrails on both sides of the stairs and use them. Fix handrails that are broken or loose. Make sure that handrails are as long as the stairways. Check any carpeting to make sure that it is firmly attached to the stairs. Fix any carpet that is loose or worn. Avoid having throw rugs at the top or bottom of the stairs. If you  do have throw rugs, attach them to the floor with carpet tape. Make sure that you have a light switch at the top of the stairs and the bottom of the stairs. If you do not have them, ask someone to add them for you. What else can I do to help prevent falls? Wear shoes that: Do not have high heels. Have rubber bottoms. Are comfortable and fit you well. Are closed at the toe. Do not wear sandals. If you use a stepladder: Make sure that it is fully opened. Do not climb a closed stepladder. Make sure that both sides of the stepladder are locked into place. Ask someone to hold it for you, if possible. Clearly mark and make sure that you can  see: Any grab bars or handrails. First and last steps. Where the edge of each step is. Use tools that help you move around (mobility aids) if they are needed. These include: Canes. Walkers. Scooters. Crutches. Turn on the lights when you go into a dark area. Replace any light bulbs as soon as they burn out. Set up your furniture so you have a clear path. Avoid moving your furniture around. If any of your floors are uneven, fix them. If there are any pets around you, be aware of where they are. Review your medicines with your doctor. Some medicines can make you feel dizzy. This can increase your chance of falling. Ask your doctor what other things that you can do to help prevent falls. This information is not intended to replace advice given to you by your health care provider. Make sure you discuss any questions you have with your health care provider. Document Released: 07/09/2009 Document Revised: 02/18/2016 Document Reviewed: 10/17/2014 Elsevier Interactive Patient Education  2017 [employer].

## 2021-06-19 ENCOUNTER — Encounter: Payer: Self-pay | Admitting: Nurse Practitioner

## 2021-06-19 DIAGNOSIS — M8588 Other specified disorders of bone density and structure, other site: Secondary | ICD-10-CM | POA: Insufficient documentation

## 2021-06-19 DIAGNOSIS — D696 Thrombocytopenia, unspecified: Secondary | ICD-10-CM | POA: Insufficient documentation

## 2021-06-22 ENCOUNTER — Encounter: Payer: Self-pay | Admitting: Nurse Practitioner

## 2021-06-22 ENCOUNTER — Ambulatory Visit: Payer: Medicare Other | Admitting: Nurse Practitioner

## 2021-06-22 ENCOUNTER — Other Ambulatory Visit: Payer: Self-pay

## 2021-06-22 VITALS — BP 118/78 | HR 83 | Temp 98.6°F | Ht 67.9 in | Wt 253.2 lb

## 2021-06-22 DIAGNOSIS — E782 Mixed hyperlipidemia: Secondary | ICD-10-CM

## 2021-06-22 DIAGNOSIS — E890 Postprocedural hypothyroidism: Secondary | ICD-10-CM

## 2021-06-22 DIAGNOSIS — D696 Thrombocytopenia, unspecified: Secondary | ICD-10-CM | POA: Diagnosis not present

## 2021-06-22 DIAGNOSIS — R7309 Other abnormal glucose: Secondary | ICD-10-CM

## 2021-06-22 DIAGNOSIS — M8588 Other specified disorders of bone density and structure, other site: Secondary | ICD-10-CM

## 2021-06-22 DIAGNOSIS — Z1231 Encounter for screening mammogram for malignant neoplasm of breast: Secondary | ICD-10-CM

## 2021-06-22 DIAGNOSIS — I10 Essential (primary) hypertension: Secondary | ICD-10-CM

## 2021-06-22 DIAGNOSIS — Z23 Encounter for immunization: Secondary | ICD-10-CM

## 2021-06-22 DIAGNOSIS — N1831 Chronic kidney disease, stage 3a: Secondary | ICD-10-CM

## 2021-06-22 DIAGNOSIS — E559 Vitamin D deficiency, unspecified: Secondary | ICD-10-CM

## 2021-06-22 LAB — MICROALBUMIN, URINE WAIVED
Creatinine, Urine Waived: 300 mg/dL (ref 10–300)
Microalb, Ur Waived: 80 mg/L — ABNORMAL HIGH (ref 0–19)

## 2021-06-22 NOTE — Assessment & Plan Note (Signed)
Chronic, stable.  Recheck thyroid panel today.  Adjust Levothyroxine dose as needed.

## 2021-06-22 NOTE — Assessment & Plan Note (Signed)
With HTN, CKD, and HLD.  BMI 38.61 today.  Recommended eating smaller high protein, low fat meals more frequently and exercising 30 mins a day 5 times a week with a goal of 10-15lb weight loss in the next 3 months. Patient voiced their understanding and motivation to adhere to these recommendations.

## 2021-06-22 NOTE — Assessment & Plan Note (Signed)
Noted on recent labs, does take daily low dose ASA, recheck CBC today.

## 2021-06-22 NOTE — Patient Instructions (Addendum)
[facility] at [facility]  Address: [address] [place], [place], [place] [address]  Phone: [phone]   Prediabetes Eating Plan Prediabetes is a condition that causes blood sugar (glucose) levels to be higher than normal. This increases the risk for developing type 2 diabetes (type 2 diabetes mellitus). Working with a health care provider or nutrition specialist (dietitian) to make diet and lifestyle changes can help prevent the onset of diabetes. These changes may help you: Control your blood glucose levels. Improve your cholesterol levels. Manage your blood pressure. What are tips for following this plan? Reading food labels Read food labels to check the amount of fat, salt (sodium), and sugar in prepackaged foods. Avoid foods that have: Saturated fats. Trans fats. Added sugars. Avoid foods that have more than 300 milligrams (mg) of sodium per serving. Limit your sodium intake to less than 2,300 mg each day. Shopping Avoid buying pre-made and processed foods. Avoid buying drinks with added sugar. Cooking Cook with olive oil. Do not use butter, lard, or ghee. Bake, broil, grill, steam, or boil foods. Avoid frying. Meal planning  Work with your dietitian to create an eating plan that is right for you. This may include tracking how many calories you take in each day. Use a food diary, notebook, or mobile application to track what you eat at each meal. Consider following a Mediterranean diet. This includes: Eating several servings of fresh fruits and vegetables each day. Eating fish at least twice a week. Eating one serving each day of whole grains, beans, nuts, and seeds. Using olive oil instead of other fats. Limiting alcohol. Limiting red meat. Using nonfat or low-fat dairy products. Consider following a plant-based diet. This includes dietary choices that focus on eating mostly vegetables and fruit, grains, beans, nuts, and seeds. If you have high blood  pressure, you may need to limit your sodium intake or follow a diet such as the DASH (Dietary Approaches to Stop Hypertension) eating plan. The DASH diet aims to lower high blood pressure. Lifestyle Set weight loss goals with help from your health care team. It is recommended that most people with prediabetes lose 7% of their body weight. Exercise for at least 30 minutes 5 or more days a week. Attend a support group or seek support from a mental health counselor. Take over-the-counter and prescription medicines only as told by your health care provider. What foods are recommended? Fruits Berries. Bananas. Apples. Oranges. Grapes. Papaya. Mango. Pomegranate. Kiwi. Grapefruit. Cherries. Vegetables Lettuce. Spinach. Peas. Beets. Cauliflower. Cabbage. Broccoli. Carrots. Tomatoes. Squash. Eggplant. Herbs. Peppers. Onions. Cucumbers. Brussels sprouts. Grains Whole grains, such as whole-wheat or whole-grain breads, crackers, cereals, and pasta. Unsweetened oatmeal. Bulgur. Barley. Quinoa. Brown rice. Corn or whole-wheat flour tortillas or taco shells. Meats and other proteins Seafood. Poultry without skin. Lean cuts of pork and beef. Tofu. Eggs. Nuts. Beans. Dairy Low-fat or fat-free dairy products, such as yogurt, cottage cheese, and cheese. Beverages Water. Tea. Coffee. Sugar-free or diet soda. Seltzer water. Low-fat or nonfat milk. Milk alternatives, such as soy or almond milk. Fats and oils Olive oil. Canola oil. Sunflower oil. Grapeseed oil. Avocado. Walnuts. Sweets and desserts Sugar-free or low-fat pudding. Sugar-free or low-fat ice cream and other frozen treats. Seasonings and condiments Herbs. Sodium-free spices. Mustard. Relish. Low-salt, low-sugar ketchup. Low-salt, low-sugar barbecue sauce. Low-fat or fat-free mayonnaise. The items listed above may not be a complete list of recommended foods and beverages. Contact a dietitian for more information. What foods are not  recommended? Fruits Fruits canned with syrup. Vegetables Canned vegetables. Frozen vegetables with butter or cream sauce. Grains Refined white flour and flour products, such as bread, pasta, snack foods, and cereals. Meats and other proteins Fatty cuts of meat. Poultry with skin. Breaded or fried meat. Processed meats. Dairy Full-fat yogurt, cheese, or milk. Beverages Sweetened drinks, such as iced tea and soda. Fats and oils Butter. Lard. Ghee. Sweets and desserts Baked goods, such as cake, cupcakes, pastries, cookies, and cheesecake. Seasonings and condiments Spice mixes with added salt. Ketchup. Barbecue sauce. Mayonnaise. The items listed above may not be a complete list of foods and beverages that are not recommended. Contact a dietitian for more information. Where to find more information American Diabetes Association: www.diabetes.org Summary You may need to make diet and lifestyle changes to help prevent the onset of diabetes. These changes can help you control blood sugar, improve cholesterol levels, and manage blood pressure. Set weight loss goals with help from your health care team. It is recommended that most people with prediabetes lose 7% of their body weight. Consider following a Mediterranean diet. This includes eating plenty of fresh fruits and vegetables, whole grains, beans, nuts, seeds, fish, and low-fat dairy, and using olive oil instead of other fats. This information is not intended to replace advice given to you by your health care provider. Make sure you discuss any questions you have with your health care provider. Document Revised: 12/12/2019 Document Reviewed: 12/12/2019 Elsevier Patient Education  2022 [employer].

## 2021-06-22 NOTE — Assessment & Plan Note (Signed)
Chronic, improved recent labs.  Continue to monitor on labs and discussed possibly starting a low dose of Lisinopril or Losartan for kidney protection and to assist in maintaining BP.  She wishes to think about this and continue to monitor function.  BMP and urine ALB today.

## 2021-06-22 NOTE — Progress Notes (Signed)
BP 118/78 (BP Location: Left Arm, Patient Position: Sitting, Cuff Size: Large)   Pulse 83   Temp 98.6 F (37 C) (Oral)   Ht 5' 7.9" (1.725 m)   Wt 253 lb 3.2 oz (114.9 kg)   LMP  (LMP Unknown)   SpO2 97%   BMI 38.61 kg/m    Subjective:    Patient ID: Wanda White, female    DOB: 07-10-1946, 75 y.o.   MRN: [id number]  HPI: Wanda White is a 75 y.o. female  Chief Complaint  Patient presents with   Hyperlipidemia   Hypothyroidism   HYPERTENSION / HYPERLIPIDEMIA No current medications for BP, diet focus.  Continues on Simvastatin for HLD.  Last labs PLT 144.   Satisfied with current treatment? no current medications for BP Duration of hypertension: years BP monitoring frequency: once and awhile BP range: 120/70-80 Duration of hyperlipidemia: years Cholesterol medication side effects: no Cholesterol supplements: none Medication compliance: good compliance Aspirin: yes Recent stressors: no Recurrent headaches: no Visual changes: no Palpitations: no Dyspnea: no Chest pain: no Lower extremity edema: no Dizzy/lightheaded: no    HYPOTHYROIDISM Current Levothyroxine 50 MCG daily.  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  Still takes Vitamin D daily due to deficiency.  Osteopenia on imaging in 2016. Thyroid control status:stable Satisfied with current treatment? yes Medication side effects: no Medication compliance: good compliance Etiology of hypothyroidism:  Recent dose adjustment:no Fatigue: no Cold intolerance: no Heat intolerance: no Weight gain: no Weight loss: no Constipation: no Diarrhea/loose stools: no Palpitations: no Lower extremity edema: no Anxiety/depressed mood: no  PREDIABETES Noted on recent labs with A1c 6%.   Polydipsia/polyuria: no Visual disturbance: no Chest pain: no Paresthesias: no  CHRONIC KIDNEY DISEASE Recent November 2021 labs improved. CKD status: stable Medications renally dose: yes Previous renal  evaluation: no Pneumovax:  Up to Date Influenza Vaccine:  Up to Date   Relevant past medical, surgical, family and social history reviewed and updated as indicated. Interim medical history since our last visit reviewed. Allergies and medications reviewed and updated.  Review of Systems  Constitutional:  Negative for activity change, appetite change, diaphoresis, fatigue and fever.  Respiratory:  Negative for cough, chest tightness and shortness of breath.   Cardiovascular:  Negative for chest pain, palpitations and leg swelling.  Gastrointestinal: Negative.   Endocrine: Negative for cold intolerance and heat intolerance.  Neurological: Negative.   Psychiatric/Behavioral: Negative.     Per HPI unless specifically indicated above     Objective:    BP 118/78 (BP Location: Left Arm, Patient Position: Sitting, Cuff Size: Large)   Pulse 83   Temp 98.6 F (37 C) (Oral)   Ht 5' 7.9" (1.725 m)   Wt 253 lb 3.2 oz (114.9 kg)   LMP  (LMP Unknown)   SpO2 97%   BMI 38.61 kg/m   Wt Readings from Last 3 Encounters:  06/22/21 253 lb 3.2 oz (114.9 kg)  06/04/21 249 lb (112.9 kg)  07/28/20 250 lb 3.2 oz (113.5 kg)    Physical Exam Vitals and nursing note reviewed.  Constitutional:      General: She is awake. She is not in acute distress.    Appearance: She is well-developed. She is obese. She is not ill-appearing.  HENT:     Head: Normocephalic.     Right Ear: Hearing normal.     Left Ear: Hearing normal.  Eyes:     General: Lids are normal.  Right eye: No discharge.        Left eye: No discharge.     Conjunctiva/sclera: Conjunctivae normal.     Pupils: Pupils are equal, round, and reactive to light.  Neck:     Thyroid: No thyromegaly.     Vascular: No carotid bruit.  Cardiovascular:     Rate and Rhythm: Normal rate and regular rhythm.     Heart sounds: Normal heart sounds. No murmur heard.   No gallop.  Pulmonary:     Effort: Pulmonary effort is normal. No accessory  muscle usage or respiratory distress.     Breath sounds: Normal breath sounds.  Abdominal:     General: Bowel sounds are normal.     Palpations: Abdomen is soft.     Tenderness: There is no abdominal tenderness.  Musculoskeletal:     Cervical back: Normal range of motion and neck supple.     Right lower leg: No edema.     Left lower leg: No edema.  Skin:    General: Skin is warm and dry.  Neurological:     Mental Status: She is alert and oriented to person, place, and time.  Psychiatric:        Attention and Perception: Attention normal.        Mood and Affect: Mood normal.        Behavior: Behavior normal. Behavior is cooperative.        Thought Content: Thought content normal.        Judgment: Judgment normal.    Results for orders placed or performed in visit on 07/28/20  Basic metabolic panel  Result Value Ref Range   Glucose 106 (H) 65 - 99 mg/dL   BUN 14 8 - 27 mg/dL   Creatinine, Ser 0.25 0.57 - 1.00 mg/dL   GFR calc non Af Amer 60 >59 mL/min/1.73   GFR calc Af Amer 69 >59 mL/min/1.73   BUN/Creatinine Ratio 15 12 - 28   Sodium 141 134 - 144 mmol/L   Potassium 3.8 3.5 - 5.2 mmol/L   Chloride 102 96 - 106 mmol/L   CO2 24 20 - 29 mmol/L   Calcium 9.5 8.7 - 10.3 mg/dL  Lipid Panel w/o Chol/HDL Ratio  Result Value Ref Range   Cholesterol, Total 181 100 - 199 mg/dL   Triglycerides 852 0 - 149 mg/dL   HDL 51 >77 mg/dL   VLDL Cholesterol Cal 24 5 - 40 mg/dL   LDL Chol Calc (NIH) 824 (H) 0 - 99 mg/dL  TSH  Result Value Ref Range   TSH 2.550 0.450 - 4.500 uIU/mL  CBC with Differential/Platelet  Result Value Ref Range   WBC 4.5 3.4 - 10.8 x10E3/uL   RBC 4.30 3.77 - 5.28 x10E6/uL   Hemoglobin 12.9 11.1 - 15.9 g/dL   Hematocrit 23.5 36.1 - 46.6 %   MCV 92 79 - 97 fL   MCH 30.0 26.6 - 33.0 pg   MCHC 32.6 31.5 - 35.7 g/dL   RDW 44.3 15.4 - 00.8 %   Platelets 144 (L) 150 - 450 x10E3/uL   Neutrophils 41 Not Estab. %   Lymphs 37 Not Estab. %   Monocytes 20 Not Estab. %    Eos 1 Not Estab. %   Basos 0 Not Estab. %   Neutrophils Absolute 1.9 1.4 - 7.0 x10E3/uL   Lymphocytes Absolute 1.7 0.7 - 3.1 x10E3/uL   Monocytes Absolute 0.9 0.1 - 0.9 x10E3/uL   EOS (ABSOLUTE) 0.0 0.0 -  0.4 x10E3/uL   Basophils Absolute 0.0 0.0 - 0.2 x10E3/uL   Immature Granulocytes 1 Not Estab. %   Immature Grans (Abs) 0.1 0.0 - 0.1 x10E3/uL  VITAMIN D 25 Hydroxy (Vit-D Deficiency, Fractures)  Result Value Ref Range   Vit D, 25-Hydroxy 61.4 30.0 - 100.0 ng/mL  HgB A1c  Result Value Ref Range   Hgb A1c MFr Bld 6.0 (H) 4.8 - 5.6 %   Est. average glucose Bld gHb Est-mCnc 126 mg/dL  T4, free  Result Value Ref Range   Free T4 1.22 0.82 - 1.77 ng/dL      Assessment & Plan:   Problem List Items Addressed This Visit       Cardiovascular and Mediastinum   Hypertension    Chronic, stable without medication.  Recommend she monitor BP at least a few mornings a week at home and document.  DASH diet at home.  Labs today: BMP and urine ALB.  Return in 6 months.         Endocrine   Hypothyroid    Chronic, stable.  Recheck thyroid panel today.  Adjust Levothyroxine dose as needed.      Relevant Orders   T4, free   TSH     Musculoskeletal and Integument   Osteopenia of lumbar spine    Noted on DEXA in 2016, will order repeat DEXA and check Vitamin D level.  Continue supplements at home.  Discussed with patient.      Relevant Orders   DG Bone Density     Genitourinary   CKD (chronic kidney disease) stage 3, GFR 30-59 ml/min (HCC) - Primary    Chronic, improved recent labs.  Continue to monitor on labs and discussed possibly starting a low dose of Lisinopril or Losartan for kidney protection and to assist in maintaining BP.  She wishes to think about this and continue to monitor function.  BMP and urine ALB today.      Relevant Orders   Basic metabolic panel   Microalbumin, Urine Waived     Other   Hyperlipidemia    Chronic, ongoing.  Continue Simvastatin and adjust as  needed.  Lipid panel today.      Relevant Orders   Lipid Panel w/o Chol/HDL Ratio   Morbid obesity (HCC)    With HTN, CKD, and HLD.  BMI 38.61 today.  Recommended eating smaller high protein, low fat meals more frequently and exercising 30 mins a day 5 times a week with a goal of 10-15lb weight loss in the next 3 months. Patient voiced their understanding and motivation to adhere to these recommendations.       Vitamin D deficiency    Continues on daily supplement, check level today and adjust regimen as needed.      Relevant Orders   VITAMIN D 25 Hydroxy (Vit-D Deficiency, Fractures)   Elevated hemoglobin A1c measurement    Recent A1c 6%, continue diet focus and recheck today.      Relevant Orders   HgB A1c   Thrombocytopenia (HCC)    Noted on recent labs, does take daily low dose ASA, recheck CBC today.      Other Visit Diagnoses     Encounter for screening mammogram for malignant neoplasm of breast       Mammogram ordered   Relevant Orders   MM 3D SCREEN BREAST BILATERAL   Need for influenza vaccination       Flu shot today   Relevant Orders   Flu Vaccine QUAD  High Dose(Fluad) (Completed)        Follow up plan: Return in about 6 months (around 12/20/2021) for PREDIABETES, HTN/HLD, THYROID, OSTEOPENIA.

## 2021-06-22 NOTE — Assessment & Plan Note (Signed)
Recent A1c 6%, continue diet focus and recheck today.

## 2021-06-22 NOTE — Assessment & Plan Note (Signed)
Chronic, ongoing. Continue Simvastatin and adjust as needed. Lipid panel today.  

## 2021-06-22 NOTE — Assessment & Plan Note (Signed)
Continues on daily supplement, check level today and adjust regimen as needed.

## 2021-06-22 NOTE — Assessment & Plan Note (Signed)
Noted on DEXA in 2016, will order repeat DEXA and check Vitamin D level.  Continue supplements at home.  Discussed with patient.

## 2021-06-22 NOTE — Assessment & Plan Note (Signed)
Chronic, stable without medication.  Recommend she monitor BP at least a few mornings a week at home and document.  DASH diet at home.  Labs today: BMP and urine ALB.  Return in 6 months.

## 2021-06-23 LAB — BASIC METABOLIC PANEL
BUN/Creatinine Ratio: 12 (ref 12–28)
BUN: 12 mg/dL (ref 8–27)
CO2: 25 mmol/L (ref 20–29)
Calcium: 9.7 mg/dL (ref 8.7–10.3)
Chloride: 102 mmol/L (ref 96–106)
Creatinine, Ser: 1.04 mg/dL — ABNORMAL HIGH (ref 0.57–1.00)
Glucose: 100 mg/dL — ABNORMAL HIGH (ref 70–99)
Potassium: 4.3 mmol/L (ref 3.5–5.2)
Sodium: 140 mmol/L (ref 134–144)
eGFR: 57 mL/min/{1.73_m2} — ABNORMAL LOW (ref >59)

## 2021-06-23 LAB — TSH: TSH: 2.49 u[IU]/mL (ref 0.450–4.500)

## 2021-06-23 LAB — LIPID PANEL W/O CHOL/HDL RATIO
Cholesterol, Total: 190 mg/dL (ref 100–199)
HDL: 51 mg/dL (ref >39)
LDL Chol Calc (NIH): 112 mg/dL — ABNORMAL HIGH (ref 0–99)
Triglycerides: 151 mg/dL — ABNORMAL HIGH (ref 0–149)
VLDL Cholesterol Cal: 27 mg/dL (ref 5–40)

## 2021-06-23 LAB — T4, FREE: Free T4: 1.29 ng/dL (ref 0.82–1.77)

## 2021-06-23 LAB — HEMOGLOBIN A1C
Est. average glucose Bld gHb Est-mCnc: 126 mg/dL
Hgb A1c MFr Bld: 6 % — ABNORMAL HIGH (ref 4.8–5.6)

## 2021-06-23 LAB — VITAMIN D 25 HYDROXY (VIT D DEFICIENCY, FRACTURES): Vit D, 25-Hydroxy: 57.4 ng/mL (ref 30.0–100.0)

## 2021-06-23 NOTE — Progress Notes (Signed)
Contacted via [place] evening Angelicia, your labs have returned: - Thyroid labs normal, can continue current medication dosing - Kidney function this check, creatinine and eGFR, shows some mild kidney disease as has been seen before.  We will continue to monitor this.  Ensure plenty of fluid at home and avoid products containing Ibuprofen. - Vitamin D is normal - Cholesterol level continue to show some elevation in LDL, bad cholesterol, are you taking Simvastatin daily -- if so then I recommend we change this to Rosuvastatin to gain better control of these levels.  Please let me know if you are taking consistently. - A1c is the diabetes check.  Your number is 6.0%, meaning you are prediabetic.  Any number 5.7 to 6.4 is considered prediabetes and any number 6.5 or greater is considered diabetes.   I would recommend heavy focus on decreasing foods high in sugar and your intake of things like bread products, pasta, and rice.  The American Diabetes Association online has a large amount of information on diet changes to make.  We will recheck this number in 6 months to ensure you are not continuing to trend upwards and move into diabetes.  Any questions? Keep being awesome!!  Thank you for allowing me to participate in your care.  I appreciate you. Kindest regards, Josemaria Brining

## 2021-08-08 ENCOUNTER — Other Ambulatory Visit: Payer: Self-pay | Admitting: Nurse Practitioner

## 2021-08-08 NOTE — Telephone Encounter (Signed)
Requested Prescriptions  Pending Prescriptions Disp Refills  . levothyroxine (SYNTHROID) 50 MCG tablet [Pharmacy Med Name: Levothyroxine Sodium 50 MCG Oral Tablet] 90 tablet 2    Sig: Take 1 tablet by mouth once daily     Endocrinology:  Hypothyroid Agents Failed - 08/08/2021  2:42 AM      Failed - TSH needs to be rechecked within 3 months after an abnormal result. Refill until TSH is due.      Passed - TSH in normal range and within 360 days    TSH  Date Value Ref Range Status  06/22/2021 2.490 0.450 - 4.500 uIU/mL Final         Passed - Valid encounter within last 12 months    Recent Outpatient Visits          1 month ago Stage 3a chronic kidney disease (HCC)   [facility] Cannady, Dorie Rank, NP   1 year ago Stage 3a chronic kidney disease (HCC)   [facility] Cannady, Dorie Rank, NP   1 year ago Essential hypertension   [facility] [place], [place] T, NP   2 years ago Essential hypertension   [facility] [place], [place] T, NP   3 years ago Disorder of rotator cuff, right   [facility] Gabriel Cirri, NP      Future Appointments            In 4 months Cannady, Dorie Rank, NP [facility]   In 10 months  [facility]

## 2021-08-11 ENCOUNTER — Telehealth: Payer: Self-pay | Admitting: Nurse Practitioner

## 2021-08-11 NOTE — Telephone Encounter (Signed)
Copied from CRM [id number]. Topic: General - Other >> Aug 11, 2021  1:45 PM Jaquita Rector A wrote: Reason for CRM: Pharmacy called in to say that they need to switch the manufacturer on patients levothyroxine (SYNTHROID) 50 MCG tablet. Please [facility] features editor Pharmacy

## 2021-08-12 NOTE — Telephone Encounter (Signed)
Called [facility] and gave the ok to change the patient's medication manufacturer per Jolene.   Called and LVM letting patient know about her medication.

## 2021-08-12 NOTE — Telephone Encounter (Signed)
Copied from CRM [id number]. Topic: General - Other >> Aug 12, 2021  1:22 PM Randol Kern wrote: Reason for CRM: Pt returned call for status update on Pharmacy request, okay from PCP per TE. Pt wants this done this afternoon she says before she runs out.

## 2021-08-12 NOTE — Telephone Encounter (Signed)
Routing to provider to advise. OK to change?

## 2021-09-07 ENCOUNTER — Other Ambulatory Visit: Payer: Self-pay | Admitting: Nurse Practitioner

## 2021-09-07 NOTE — Telephone Encounter (Signed)
Pt has meds left until end of month. Has appt tomororw 09/08/21 at 1040 with provider.   Requested Prescriptions  Pending Prescriptions Disp Refills   simvastatin (ZOCOR) 40 MG tablet 90 tablet 3    Sig: Take 1 tablet (40 mg total) by mouth at bedtime.     Cardiovascular:  Antilipid - Statins Failed - 09/07/2021 12:56 PM      Failed - LDL in normal range and within 360 days    LDL Chol Calc (NIH)  Date Value Ref Range Status  06/22/2021 112 (H) 0 - 99 mg/dL Final         Failed - Triglycerides in normal range and within 360 days    Triglycerides  Date Value Ref Range Status  06/22/2021 151 (H) 0 - 149 mg/dL Final         Passed - Total Cholesterol in normal range and within 360 days    Cholesterol, Total  Date Value Ref Range Status  06/22/2021 190 100 - 199 mg/dL Final         Passed - HDL in normal range and within 360 days    HDL  Date Value Ref Range Status  06/22/2021 51 >39 mg/dL Final         Passed - Patient is not pregnant      Passed - Valid encounter within last 12 months    Recent Outpatient Visits          2 months ago Stage 3a chronic kidney disease (HCC)   [facility] Cannady, Dorie Rank, NP   1 year ago Stage 3a chronic kidney disease (HCC)   [facility] Cannady, Dorie Rank, NP   2 years ago Essential hypertension   [facility] [place], [place] T, NP   2 years ago Essential hypertension   [facility] [place], [place] T, NP   3 years ago Disorder of rotator cuff, right   [facility] Gabriel Cirri, NP      Future Appointments            Tomorrow Larae Grooms, NP [facility]   In 3 months Cannady, Dorie Rank, NP [facility]   In 9 months  [facility]

## 2021-09-07 NOTE — Telephone Encounter (Signed)
Copied from CRM [id number]. Topic: Quick Communication - Rx Refill/Question >> Sep 07, 2021 12:18 PM Izora Ribas, Everette A wrote: Medication: simvastatin (ZOCOR) 40 MG tablet [[id number]] - patient has 3 tablets remaining   Has the patient contacted their pharmacy? Yes.  The patient has been directed to contact their PCP (Agent: If no, request that the patient contact the pharmacy for the refill. If patient does not wish to contact the pharmacy document the reason why and proceed with request.) (Agent: If yes, when and what did the pharmacy advise?)  Preferred Pharmacy (with phone number or street name): [facility] [address], [place] - [address]  Phone:  [phone] Fax:  [phone]   Has the patient been seen for an appointment in the last year OR does the patient have an upcoming appointment? Yes.    Agent: Please be advised that RX refills may take up to 3 business days. We ask that you follow-up with your pharmacy.

## 2021-09-08 ENCOUNTER — Encounter: Payer: Self-pay | Admitting: Nurse Practitioner

## 2021-09-08 ENCOUNTER — Other Ambulatory Visit: Payer: Self-pay

## 2021-09-08 ENCOUNTER — Ambulatory Visit (INDEPENDENT_AMBULATORY_CARE_PROVIDER_SITE_OTHER): Payer: Medicare Other | Admitting: Nurse Practitioner

## 2021-09-08 VITALS — BP 126/78 | HR 85 | Temp 98.7°F | Wt 256.8 lb

## 2021-09-08 DIAGNOSIS — J0110 Acute frontal sinusitis, unspecified: Secondary | ICD-10-CM

## 2021-09-08 MED ORDER — DOXYCYCLINE HYCLATE 100 MG PO TABS: 100.0000 mg | ORAL_TABLET | Freq: Two times a day (BID) | ORAL | 0 refills | Status: DC

## 2021-09-08 MED ORDER — BENZONATATE 200 MG PO CAPS: 200.0000 mg | ORAL_CAPSULE | Freq: Two times a day (BID) | ORAL | 0 refills | Status: DC | PRN

## 2021-09-08 MED ORDER — SIMVASTATIN 40 MG PO TABS: 40.0000 mg | ORAL_TABLET | Freq: Every day | ORAL | 4 refills | Status: DC

## 2021-09-08 NOTE — Progress Notes (Signed)
BP 126/78    Pulse 85    Temp 98.7 F (37.1 C) (Oral)    Wt 256 lb 12.8 oz (116.5 kg)    LMP  (LMP Unknown)    SpO2 97%    BMI 39.16 kg/m    Subjective:    Patient ID: Wanda White, female    DOB: [birth year], 75 y.o.   MRN: [id number]  HPI: Wanda White is a 75 y.o. female  Chief Complaint  Patient presents with   URI    Pt states she has been having a productive cough, sinus pressure, and congestion for 10 days. States she has taken Aleve cold and sinus with no change in symptoms.    UPPER RESPIRATORY TRACT INFECTION Worst symptom: symptoms have been going on for about 10 days Fever: no Cough: yes Shortness of breath: no Wheezing: no Chest pain: no Chest tightness: no Chest congestion: yes Nasal congestion: yes Runny nose: yes Post nasal drip: yes Sneezing: yes Sore throat: yes Swollen glands: no Sinus pressure: yes Headache: yes Face pain: yes Toothache: no Ear pain: yes bilateral Ear pressure: yes bilateral Eyes red/itching:no Eye drainage/crusting: no  Vomiting: no Rash: no Fatigue: yes  Relevant past medical, surgical, family and social history reviewed and updated as indicated. Interim medical history since our last visit reviewed. Allergies and medications reviewed and updated.  Review of Systems  Constitutional:  Positive for fatigue. Negative for fever.  HENT:  Positive for congestion, ear pain, postnasal drip, rhinorrhea, sinus pressure, sinus pain, sneezing and sore throat. Negative for dental problem.   Respiratory:  Positive for cough. Negative for shortness of breath and wheezing.   Cardiovascular:  Negative for chest pain.  Gastrointestinal:  Negative for vomiting.  Skin:  Negative for rash.  Neurological:  Positive for headaches.   Per HPI unless specifically indicated above     Objective:    BP 126/78    Pulse 85    Temp 98.7 F (37.1 C) (Oral)    Wt 256 lb 12.8 oz (116.5 kg)    LMP  (LMP Unknown)    SpO2 97%    BMI 39.16 kg/m   Wt  Readings from Last 3 Encounters:  09/08/21 256 lb 12.8 oz (116.5 kg)  06/22/21 253 lb 3.2 oz (114.9 kg)  06/04/21 249 lb (112.9 kg)    Physical Exam Vitals and nursing note reviewed.  Constitutional:      General: She is not in acute distress.    Appearance: Normal appearance. She is normal weight. She is not ill-appearing, toxic-appearing or diaphoretic.  HENT:     Head: Normocephalic.     Right Ear: Tympanic membrane and external ear normal.     Left Ear: Tympanic membrane and external ear normal.     Nose: Congestion and rhinorrhea present.     Right Sinus: Frontal sinus tenderness present. No maxillary sinus tenderness.     Left Sinus: Frontal sinus tenderness present. No maxillary sinus tenderness.     Mouth/Throat:     Mouth: Mucous membranes are moist.     Pharynx: Posterior oropharyngeal erythema present. No oropharyngeal exudate.  Eyes:     General:        Right eye: No discharge.        Left eye: No discharge.     Extraocular Movements: Extraocular movements intact.     Conjunctiva/sclera: Conjunctivae normal.     Pupils: Pupils are equal, round, and reactive to light.  Cardiovascular:  Rate and Rhythm: Normal rate and regular rhythm.     Heart sounds: No murmur heard. Pulmonary:     Effort: Pulmonary effort is normal. No respiratory distress.     Breath sounds: Normal breath sounds. No wheezing or rales.  Musculoskeletal:     Cervical back: Normal range of motion and neck supple.  Skin:    General: Skin is warm and dry.     Capillary Refill: Capillary refill takes less than 2 seconds.  Neurological:     General: No focal deficit present.     Mental Status: She is alert and oriented to person, place, and time. Mental status is at baseline.  Psychiatric:        Mood and Affect: Mood normal.        Behavior: Behavior normal.        Thought Content: Thought content normal.        Judgment: Judgment normal.    Results for orders placed or performed in visit on  06/22/21  T4, free  Result Value Ref Range   Free T4 1.29 0.82 - 1.77 ng/dL  TSH  Result Value Ref Range   TSH 2.490 0.450 - 4.500 uIU/mL  Basic metabolic panel  Result Value Ref Range   Glucose 100 (H) 70 - 99 mg/dL   BUN 12 8 - 27 mg/dL   Creatinine, Ser 1.04 (H) 0.57 - 1.00 mg/dL   eGFR 57 (L) >59 mL/min/1.73   BUN/Creatinine Ratio 12 12 - 28   Sodium 140 134 - 144 mmol/L   Potassium 4.3 3.5 - 5.2 mmol/L   Chloride 102 96 - 106 mmol/L   CO2 25 20 - 29 mmol/L   Calcium 9.7 8.7 - 10.3 mg/dL  Lipid Panel w/o Chol/HDL Ratio  Result Value Ref Range   Cholesterol, Total 190 100 - 199 mg/dL   Triglycerides 151 (H) 0 - 149 mg/dL   HDL 51 >39 mg/dL   VLDL Cholesterol Cal 27 5 - 40 mg/dL   LDL Chol Calc (NIH) 112 (H) 0 - 99 mg/dL  HgB A1c  Result Value Ref Range   Hgb A1c MFr Bld 6.0 (H) 4.8 - 5.6 %   Est. average glucose Bld gHb Est-mCnc 126 mg/dL  Microalbumin, Urine Waived  Result Value Ref Range   Microalb, Ur Waived 80 (H) 0 - 19 mg/L   Creatinine, Urine Waived 300 10 - 300 mg/dL   Microalb/Creat Ratio 30-300 (H) <30 mg/g  VITAMIN D 25 Hydroxy (Vit-D Deficiency, Fractures)  Result Value Ref Range   Vit D, 25-Hydroxy 57.4 30.0 - 100.0 ng/mL      Assessment & Plan:   Problem List Items Addressed This Visit   None Visit Diagnoses     Acute non-recurrent frontal sinusitis    -  Primary   Complete course of antibiotics. Given tessalon to help with cough. Continue with OTC cough/cold medication. FU if symptoms worsen or fail to improve.   Relevant Medications   doxycycline (VIBRA-TABS) 100 MG tablet   benzonatate (TESSALON) 200 MG capsule        Follow up plan: Return if symptoms worsen or fail to improve.

## 2021-12-22 ENCOUNTER — Ambulatory Visit: Payer: Medicare Other | Admitting: Nurse Practitioner

## 2022-01-26 ENCOUNTER — Ambulatory Visit: Payer: Medicare Other | Admitting: Nurse Practitioner

## 2022-03-16 ENCOUNTER — Ambulatory Visit: Payer: Medicare Other | Admitting: Nurse Practitioner

## 2022-04-09 NOTE — Patient Instructions (Signed)

## 2022-04-13 ENCOUNTER — Encounter: Payer: Self-pay | Admitting: Nurse Practitioner

## 2022-04-13 ENCOUNTER — Ambulatory Visit (INDEPENDENT_AMBULATORY_CARE_PROVIDER_SITE_OTHER): Payer: Medicare Other | Admitting: Nurse Practitioner

## 2022-04-13 VITALS — BP 107/73 | HR 78 | Temp 98.3°F | Ht 67.9 in | Wt 257.0 lb

## 2022-04-13 DIAGNOSIS — I10 Essential (primary) hypertension: Secondary | ICD-10-CM | POA: Diagnosis not present

## 2022-04-13 DIAGNOSIS — E782 Mixed hyperlipidemia: Secondary | ICD-10-CM

## 2022-04-13 DIAGNOSIS — N1831 Chronic kidney disease, stage 3a: Secondary | ICD-10-CM | POA: Diagnosis not present

## 2022-04-13 DIAGNOSIS — R7309 Other abnormal glucose: Secondary | ICD-10-CM

## 2022-04-13 DIAGNOSIS — E559 Vitamin D deficiency, unspecified: Secondary | ICD-10-CM

## 2022-04-13 DIAGNOSIS — E039 Hypothyroidism, unspecified: Secondary | ICD-10-CM

## 2022-04-13 DIAGNOSIS — D696 Thrombocytopenia, unspecified: Secondary | ICD-10-CM | POA: Diagnosis not present

## 2022-04-13 LAB — BAYER DCA HB A1C WAIVED: HB A1C (BAYER DCA - WAIVED): 6.2 % — ABNORMAL HIGH (ref 4.8–5.6)

## 2022-04-13 LAB — MICROALBUMIN, URINE WAIVED
Creatinine, Urine Waived: 200 mg/dL (ref 10–300)
Microalb, Ur Waived: 80 mg/L — ABNORMAL HIGH (ref 0–19)

## 2022-04-13 MED ORDER — METFORMIN HCL 500 MG PO TABS: 500.0000 mg | ORAL_TABLET | Freq: Two times a day (BID) | ORAL | 3 refills | Status: DC

## 2022-04-13 NOTE — Assessment & Plan Note (Addendum)
Recheck level today, previously 6%. She has some tingling in her feet. Will start Metformin 500 MG at this time to help with elevated A1c and weight loss -- educated her on medication + possible side effects that may present, to alert provider if any issues.

## 2022-04-13 NOTE — Progress Notes (Signed)
Contacted via MyChart   Good afternoon Wanda White, as we discussed A1c (diabetes testing) has crept up a little bit to 6.2%.  Getting closer to diabetic level of 6.5% or greater.  Definitely start the Metformin as we discussed and if any issues let me know. Urine protein level remains elevated in future if may be good to add on a very low dose blood pressure medication to protect kidneys as we have discussed in past, we will discuss more next visit.  Any questions? Keep being amazing!!  Thank you for allowing me to participate in your care.  I appreciate you. Kindest regards, Dezeray Puccio

## 2022-04-13 NOTE — Assessment & Plan Note (Signed)
Chronic, stable without medication. Recommend she monitor BP at least a few mornings a week at home and document. DASH diet at home. Labs today: CMP. Return in 6 months.

## 2022-04-13 NOTE — Assessment & Plan Note (Signed)
Chronic, stable. Continue to monitor on labs. Consider adding Lisinopril or Losartan in the future for kidney protection. CMP and urine ALB today.

## 2022-04-13 NOTE — Progress Notes (Deleted)
BP 107/73   Pulse 78   Temp 98.3 F (36.8 C) (Oral)   Ht 5' 7.9" (1.725 m)   Wt 257 lb (116.6 kg)   LMP  (LMP Unknown)   SpO2 94%   BMI 39.19 kg/m    Subjective:    Patient ID: Wanda White, female    DOB: 12/02/1945, 76 y.o.   MRN: [id number]  HPI: Wanda White is a 76 y.o. female  Chief Complaint  Patient presents with   Mammogram    Patient says she is scheduled for Mammogram on 05/13/22.    Colon Cancer Screening   Hyperlipidemia   Hypertension   Hypothyroidism   Osteopenia   Weight Management Screening    Patient says she having issues with Weight Loss and would like to discuss with provider.    HYPOTHYROIDISM Continues on Levothyroxine 50 MCG daily. Thyroid control status:{Blank single:19197::"controlled","uncontrolled","better","worse","exacerbated","stable"} Satisfied with current treatment? {Blank single:19197::"yes","no"} Medication side effects: {Blank single:19197::"yes","no"} Medication compliance: {Blank single:19197::"excellent compliance","good compliance","fair compliance","poor compliance"} Etiology of hypothyroidism:  Recent dose adjustment:{Blank single:19197::"yes","no"} Fatigue: {Blank single:19197::"yes","no"} Cold intolerance: {Blank single:19197::"yes","no"} Heat intolerance: {Blank single:19197::"yes","no"} Weight gain: {Blank single:19197::"yes","no"} Weight loss: {Blank single:19197::"yes","no"} Constipation: {Blank single:19197::"yes","no"} Diarrhea/loose stools: {Blank single:19197::"yes","no"} Palpitations: {Blank single:19197::"yes","no"} Lower extremity edema: {Blank single:19197::"yes","no"} Anxiety/depressed mood: {Blank single:19197::"yes","no"}   HYPERTENSION / HYPERLIPIDEMIA Continues on ASA, Simvastatin, and fish oil.  No BP medications. BP monitoring frequency: {Blank single:19197::"not checking","rarely","daily","weekly","monthly","a few times a day","a few times a week","a few times a month"} BP range:  Duration of  hyperlipidemia: {Blank single:19197::"chronic","months","years"} Cholesterol medication side effects: {Blank single:19197::"yes","no"} Cholesterol supplements: {Blank multiple:19196::"none","fish oil","niacin","red yeast rice"} Medication compliance: {Blank single:19197::"excellent compliance","good compliance","fair compliance","poor compliance"} Aspirin: {Blank single:19197::"yes","no"} Recent stressors: {Blank single:19197::"yes","no"} Recurrent headaches: {Blank single:19197::"yes","no"} Visual changes: {Blank single:19197::"yes","no"} Palpitations: {Blank single:19197::"yes","no"} Dyspnea: {Blank single:19197::"yes","no"} Chest pain: {Blank single:19197::"yes","no"} Lower extremity edema: {Blank single:19197::"yes","no"} Dizzy/lightheaded: {Blank single:19197::"yes","no"}   CHRONIC KIDNEY DISEASE Noted on labs -- remaining stable. CKD status: {Blank single:19197::"controlled","uncontrolled","better","worse","exacerbated","stable"} Medications renally dose: {Blank single:19197::"yes","no"} Previous renal evaluation: {Blank single:19197::"yes","no"} Pneumovax:  {Blank single:19197::"Up to Date","Not up to Date","unknown"} Influenza Vaccine:  {Blank single:19197::"Up to Date","Not up to Date","unknown"}   IFG Last A1c was 6% in September 2022. Polydipsia/polyuria: {Blank single:19197::"yes","no"} Visual disturbance: {Blank single:19197::"yes","no"} Chest pain: {Blank single:19197::"yes","no"}  OSTEOPENIA Noted on past DEXA, taking supplements. Satisfied with current treatment?: {Blank single:19197::"yes","no"} Adequate calcium & vitamin D: {Blank single:19197::"yes","no"} Weight bearing exercises: {Blank single:19197::"yes","no"}   Relevant past medical, surgical, family and social history reviewed and updated as indicated. Interim medical history since our last visit reviewed. Allergies and medications reviewed and updated.  Review of Systems  Per HPI unless specifically  indicated above     Objective:    BP 107/73   Pulse 78   Temp 98.3 F (36.8 C) (Oral)   Ht 5' 7.9" (1.725 m)   Wt 257 lb (116.6 kg)   LMP  (LMP Unknown)   SpO2 94%   BMI 39.19 kg/m   Wt Readings from Last 3 Encounters:  04/13/22 257 lb (116.6 kg)  09/08/21 256 lb 12.8 oz (116.5 kg)  06/22/21 253 lb 3.2 oz (114.9 kg)    Physical Exam  Results for orders placed or performed in visit on 06/22/21  T4, free  Result Value Ref Range   Free T4 1.29 0.82 - 1.77 ng/dL  TSH  Result Value Ref Range   TSH 2.490 0.450 - 4.500 uIU/mL  Basic metabolic panel  Result Value Ref Range   Glucose 100 (H) 70 - 99 mg/dL   BUN 12  8 - 27 mg/dL   Creatinine, Ser 1.04 (H) 0.57 - 1.00 mg/dL   eGFR 57 (L) >59 mL/min/1.73   BUN/Creatinine Ratio 12 12 - 28   Sodium 140 134 - 144 mmol/L   Potassium 4.3 3.5 - 5.2 mmol/L   Chloride 102 96 - 106 mmol/L   CO2 25 20 - 29 mmol/L   Calcium 9.7 8.7 - 10.3 mg/dL  Lipid Panel w/o Chol/HDL Ratio  Result Value Ref Range   Cholesterol, Total 190 100 - 199 mg/dL   Triglycerides 151 (H) 0 - 149 mg/dL   HDL 51 >39 mg/dL   VLDL Cholesterol Cal 27 5 - 40 mg/dL   LDL Chol Calc (NIH) 112 (H) 0 - 99 mg/dL  HgB A1c  Result Value Ref Range   Hgb A1c MFr Bld 6.0 (H) 4.8 - 5.6 %   Est. average glucose Bld gHb Est-mCnc 126 mg/dL  Microalbumin, Urine Waived  Result Value Ref Range   Microalb, Ur Waived 80 (H) 0 - 19 mg/L   Creatinine, Urine Waived 300 10 - 300 mg/dL   Microalb/Creat Ratio 30-300 (H) <30 mg/g  VITAMIN D 25 Hydroxy (Vit-D Deficiency, Fractures)  Result Value Ref Range   Vit D, 25-Hydroxy 57.4 30.0 - 100.0 ng/mL      Assessment & Plan:   Problem List Items Addressed This Visit       Cardiovascular and Mediastinum   Hypertension   Relevant Orders   CBC with Differential/Platelet   Comprehensive metabolic panel     Endocrine   Hypothyroid   Relevant Orders   TSH   Thyroid peroxidase antibody   T4, free     Genitourinary   CKD  (chronic kidney disease) stage 3, GFR 30-59 ml/min (HCC) - Primary   Relevant Orders   Microalbumin, Urine Waived   CBC with Differential/Platelet   Comprehensive metabolic panel     Hematopoietic and Hemostatic   Thrombocytopenia (HCC)   Relevant Orders   CBC with Differential/Platelet     Other   Elevated hemoglobin A1c measurement   Relevant Orders   Bayer DCA Hb A1c Waived   Microalbumin, Urine Waived   Hyperlipidemia   Relevant Orders   Comprehensive metabolic panel   Lipid Panel w/o Chol/HDL Ratio   Morbid obesity (HCC)     Follow up plan: No follow-ups on file.

## 2022-04-13 NOTE — Assessment & Plan Note (Addendum)
BMI 39.19 today. Will start Metformin, see elevated Hgb A1c plan of care.  Recommended eating smaller high protein, low fat meals more frequently and exercising 30 mins a day 5 times a week with a goal of 10-15lb weight loss in the next 3 months. Patient voiced their understanding and motivation to adhere to these recommendations.

## 2022-04-13 NOTE — Assessment & Plan Note (Signed)
Chronic, ongoing. Continue Simvastatin and adjust as needed. Lipid panel today.

## 2022-04-13 NOTE — Assessment & Plan Note (Signed)
Noted on previous labs, she does take low dose daily ASA. Recheck CBC today.

## 2022-04-13 NOTE — Assessment & Plan Note (Signed)
Continues on daily supplement. Check level today and adjust as needed.

## 2022-04-13 NOTE — Assessment & Plan Note (Signed)
Chronic, stable on current medication regimen. Check TSH and free T4 today. Adjust Levothyroxine as needed.

## 2022-04-13 NOTE — Progress Notes (Signed)
BP 107/73   Pulse 78   Temp 98.3 F (36.8 C) (Oral)   Ht 5' 7.9" (1.725 m)   Wt 257 lb (116.6 kg)   LMP  (LMP Unknown)   SpO2 94%   BMI 39.19 kg/m    Subjective:    Patient ID: Wanda White, female    DOB: [birth year], 76 y.o.   MRN: [id number]  HPI: Wanda White is a 76 y.o. female  Chief Complaint  Patient presents with   Hyperlipidemia   Hypertension   Hypothyroidism   Osteopenia   Weight Management Screening    Patient says she having issues with Weight Loss and would like to discuss with provider.    NOTE WRITTEN BY FNP STUDENT.  ASSESSMENT AND PLAN OF CARE REVIEWED WITH STUDENT, AGREE WITH ABOVE FINDINGS AND PLAN.   HYPOTHYROIDISM Continues on Levothyroxine 50 MCG daily. Thyroid control status:controlled Satisfied with current treatment? yes Medication side effects: no Medication compliance: excellent compliance Etiology of hypothyroidism:  Recent dose adjustment:no Fatigue: no Cold intolerance: no Heat intolerance: no Weight gain: no Weight loss: no Constipation: no Diarrhea/loose stools: no Palpitations: no Lower extremity edema: yes Anxiety/depressed mood: no   HYPERTENSION / HYPERLIPIDEMIA Continues on ASA, Simvastatin, and fish oil.  No BP medications. She wants to discuss weight loss, she has been trying but is unable to lose any weight over past 6 months. She has cut out pasta, tries to minimize carbs and decrease portion sizes.  Is not eating breakfast, not a big breakfast eater.  Currently not exercising due to heat, but does walk when temps are cooler.  Discussed weight loss options with her, including injectable like Wegovy. BP monitoring frequency: daily BP range: 110-120 Duration of hyperlipidemia: years Cholesterol medication side effects: no Cholesterol supplements: fish oil Medication compliance: good compliance Aspirin: yes Recent stressors: no Recurrent headaches: no Visual changes: no Palpitations: no Dyspnea: no Chest pain:  no Lower extremity edema: yes Dizzy/lightheaded: no   CHRONIC KIDNEY DISEASE Noted on labs -- remaining stable. CKD status: stable Medications renally dose: no Previous renal evaluation: no Pneumovax:  Up to Date Influenza Vaccine:  Up to Date   IFG Last A1c was 6% in September 2022. She is experiencing some tingling in her feet.  Polydipsia/polyuria: no Visual disturbance: no Chest pain: no  OSTEOPENIA Noted on past DEXA, taking supplements. Satisfied with current treatment?: yes Adequate calcium & vitamin D: yes Weight bearing exercises: no   Relevant past medical, surgical, family and social history reviewed and updated as indicated. Interim medical history since our last visit reviewed. Allergies and medications reviewed and updated.  Review of Systems  Constitutional:  Negative for activity change, chills, fatigue and fever.  HENT: Negative.    Respiratory:  Negative for cough, chest tightness and shortness of breath.   Cardiovascular:  Positive for leg swelling (ankles). Negative for chest pain and palpitations.  Gastrointestinal:  Negative for abdominal distention, constipation, nausea and vomiting.  Endocrine: Negative for cold intolerance, heat intolerance, polydipsia, polyphagia and polyuria.  Musculoskeletal: Negative.   Allergic/Immunologic: Negative.   Neurological:  Negative for dizziness, weakness and headaches.  Psychiatric/Behavioral: Negative.      Per HPI unless specifically indicated above     Objective:    BP 107/73   Pulse 78   Temp 98.3 F (36.8 C) (Oral)   Ht 5' 7.9" (1.725 m)   Wt 257 lb (116.6 kg)   LMP  (LMP Unknown)   SpO2 94%   BMI 39.19  kg/m   Wt Readings from Last 3 Encounters:  04/13/22 257 lb (116.6 kg)  09/08/21 256 lb 12.8 oz (116.5 kg)  06/22/21 253 lb 3.2 oz (114.9 kg)    Physical Exam Vitals and nursing note reviewed.  Constitutional:      General: She is awake. She is not in acute distress.    Appearance: She is  well-developed. She is obese. She is not ill-appearing.  HENT:     Head: Normocephalic and atraumatic.     Right Ear: Hearing normal.     Left Ear: Hearing normal.     Mouth/Throat:     Mouth: Mucous membranes are moist.  Eyes:     General: Lids are normal.        Right eye: No discharge.        Left eye: No discharge.     Conjunctiva/sclera: Conjunctivae normal.     Pupils: Pupils are equal, round, and reactive to light.  Neck:     Thyroid: No thyromegaly or thyroid tenderness.     Vascular: No carotid bruit.  Cardiovascular:     Rate and Rhythm: Normal rate and regular rhythm.     Heart sounds: Normal heart sounds. No murmur heard.    No gallop.  Pulmonary:     Effort: Pulmonary effort is normal. No accessory muscle usage or respiratory distress.     Breath sounds: Normal breath sounds.  Abdominal:     General: Bowel sounds are normal.     Palpations: Abdomen is soft.     Tenderness: There is no abdominal tenderness.  Musculoskeletal:     Cervical back: Normal range of motion and neck supple.     Right lower leg: No edema.     Left lower leg: No edema.  Lymphadenopathy:     Head:     Right side of head: No submental, submandibular, tonsillar, preauricular or posterior auricular adenopathy.     Left side of head: No submental, submandibular, tonsillar, preauricular or posterior auricular adenopathy.  Skin:    General: Skin is warm and dry.  Neurological:     General: No focal deficit present.     Mental Status: She is alert and oriented to person, place, and time.     Deep Tendon Reflexes: Reflexes are normal and symmetric.  Psychiatric:        Attention and Perception: Attention normal.        Mood and Affect: Mood normal.        Speech: Speech normal.        Behavior: Behavior normal. Behavior is cooperative.        Thought Content: Thought content normal.        Judgment: Judgment normal.    Results for orders placed or performed in visit on 04/13/22  Bayer DCA  Hb A1c Waived  Result Value Ref Range   HB A1C (BAYER DCA - WAIVED) 6.2 (H) 4.8 - 5.6 %  Microalbumin, Urine Waived  Result Value Ref Range   Microalb, Ur Waived 80 (H) 0 - 19 mg/L   Creatinine, Urine Waived 200 10 - 300 mg/dL   Microalb/Creat Ratio 30-300 (H) <30 mg/g      Assessment & Plan:   Problem List Items Addressed This Visit       Cardiovascular and Mediastinum   Hypertension    Chronic, stable without medication. Recommend she monitor BP at least a few mornings a week at home and document. DASH diet at home. Labs today:  CMP. Return in 6 months.      Relevant Orders   CBC with Differential/Platelet   Comprehensive metabolic panel     Endocrine   Hypothyroid    Chronic, stable on current medication regimen. Check TSH and free T4 today. Adjust Levothyroxine as needed.       Relevant Orders   TSH   Thyroid peroxidase antibody   T4, free     Genitourinary   CKD (chronic kidney disease) stage 3, GFR 30-59 ml/min (HCC) - Primary    Chronic, stable. Continue to monitor on labs. Consider adding Lisinopril or Losartan in the future for kidney protection. CMP and urine ALB today.       Relevant Orders   Microalbumin, Urine Waived (Completed)   CBC with Differential/Platelet   Comprehensive metabolic panel     Hematopoietic and Hemostatic   Thrombocytopenia (HCC)    Noted on previous labs, she does take low dose daily ASA. Recheck CBC today.       Relevant Orders   CBC with Differential/Platelet     Other   Elevated hemoglobin A1c measurement    Recheck level today, previously 6%. She has some tingling in her feet. Will start Metformin 500 MG at this time to help with elevated A1c and weight loss -- educated her on medication + possible side effects that may present, to alert provider if any issues.       Relevant Orders   Bayer DCA Hb A1c Waived (Completed)   Microalbumin, Urine Waived (Completed)   Hyperlipidemia    Chronic, ongoing. Continue Simvastatin  and adjust as needed. Lipid panel today.       Relevant Orders   Comprehensive metabolic panel   Lipid Panel w/o Chol/HDL Ratio   Morbid obesity (HCC)    BMI 39.19 today. Will start Metformin, see elevated Hgb A1c plan of care.  Recommended eating smaller high protein, low fat meals more frequently and exercising 30 mins a day 5 times a week with a goal of 10-15lb weight loss in the next 3 months. Patient voiced their understanding and motivation to adhere to these recommendations.        Relevant Medications   metFORMIN (GLUCOPHAGE) 500 MG tablet   Vitamin D deficiency    Continues on daily supplement. Check level today and adjust as needed.         Follow up plan: Return in about 6 weeks (around 05/25/2022) for WEIGHT CHECK/PREDIABETES -- metformin started.Marland Kitchen

## 2022-04-14 ENCOUNTER — Encounter: Payer: Self-pay | Admitting: Nurse Practitioner

## 2022-04-14 LAB — CBC WITH DIFFERENTIAL/PLATELET
Basophils Absolute: 0 10*3/uL (ref 0.0–0.2)
Basos: 1 %
EOS (ABSOLUTE): 0 10*3/uL (ref 0.0–0.4)
Eos: 1 %
Hematocrit: 41.4 % (ref 34.0–46.6)
Hemoglobin: 13.9 g/dL (ref 11.1–15.9)
Immature Grans (Abs): 0 10*3/uL (ref 0.0–0.1)
Immature Granulocytes: 1 %
Lymphocytes Absolute: 1.5 10*3/uL (ref 0.7–3.1)
Lymphs: 38 %
MCH: 30.5 pg (ref 26.6–33.0)
MCHC: 33.6 g/dL (ref 31.5–35.7)
MCV: 91 fL (ref 79–97)
Monocytes Absolute: 0.8 10*3/uL (ref 0.1–0.9)
Monocytes: 20 %
Neutrophils Absolute: 1.6 10*3/uL (ref 1.4–7.0)
Neutrophils: 39 %
Platelets: 131 10*3/uL — ABNORMAL LOW (ref 150–450)
RBC: 4.56 x10E6/uL (ref 3.77–5.28)
RDW: 11.8 % (ref 11.7–15.4)
WBC: 3.9 10*3/uL (ref 3.4–10.8)

## 2022-04-14 LAB — COMPREHENSIVE METABOLIC PANEL
ALT: 23 IU/L (ref 0–32)
AST: 25 IU/L (ref 0–40)
Albumin/Globulin Ratio: 1.7 (ref 1.2–2.2)
Albumin: 4.7 g/dL (ref 3.8–4.8)
Alkaline Phosphatase: 66 IU/L (ref 44–121)
BUN/Creatinine Ratio: 12 (ref 12–28)
BUN: 13 mg/dL (ref 8–27)
Bilirubin Total: 0.4 mg/dL (ref 0.0–1.2)
CO2: 23 mmol/L (ref 20–29)
Calcium: 9.7 mg/dL (ref 8.7–10.3)
Chloride: 102 mmol/L (ref 96–106)
Creatinine, Ser: 1.06 mg/dL — ABNORMAL HIGH (ref 0.57–1.00)
Globulin, Total: 2.7 g/dL (ref 1.5–4.5)
Glucose: 109 mg/dL — ABNORMAL HIGH (ref 70–99)
Potassium: 4.4 mmol/L (ref 3.5–5.2)
Sodium: 139 mmol/L (ref 134–144)
Total Protein: 7.4 g/dL (ref 6.0–8.5)
eGFR: 55 mL/min/{1.73_m2} — ABNORMAL LOW (ref >59)

## 2022-04-14 LAB — TSH: TSH: 1.83 u[IU]/mL (ref 0.450–4.500)

## 2022-04-14 LAB — LIPID PANEL W/O CHOL/HDL RATIO
Cholesterol, Total: 167 mg/dL (ref 100–199)
HDL: 49 mg/dL (ref >39)
LDL Chol Calc (NIH): 95 mg/dL (ref 0–99)
Triglycerides: 130 mg/dL (ref 0–149)
VLDL Cholesterol Cal: 23 mg/dL (ref 5–40)

## 2022-04-14 LAB — THYROID PEROXIDASE ANTIBODY: Thyroperoxidase Ab SerPl-aCnc: 75 IU/mL — ABNORMAL HIGH (ref 0–34)

## 2022-04-14 LAB — T4, FREE: Free T4: 1.18 ng/dL (ref 0.82–1.77)

## 2022-04-14 MED ORDER — SIMVASTATIN 40 MG PO TABS: 40.0000 mg | ORAL_TABLET | Freq: Every day | ORAL | 4 refills | Status: DC

## 2022-04-14 MED ORDER — LEVOTHYROXINE SODIUM 50 MCG PO TABS: 50.0000 ug | ORAL_TABLET | Freq: Every day | ORAL | 4 refills | Status: DC

## 2022-04-14 NOTE — Progress Notes (Signed)
Contacted via MyChart   Good afternoon Oral, your labs have returned: - CBC shows no anemia or infection.  Platelet are on low side again, we will recheck next visit as this has trended down a little.  If taking Aspirin hold this and do not take until after next lab check. - Kidney function continues to show mild kidney disease with no progression, this is good news and we will continue to monitor.   - Thyroid labs are normal, with exception of antibody which is a little elevated showing your thyroid disease is more Hashimoto's thyroiditis.  We treat same as we are now and I sent in refills. - Cholesterol labs better, I sent in Simvastatin refills.  Any questions? Keep being amazing!!  Thank you for allowing me to participate in your care.  I appreciate you. Kindest regards, Joyclyn Plazola

## 2022-04-14 NOTE — Addendum Note (Signed)
Addended by: Aura Dials T on: 04/14/2022 03:03 PM   Modules accepted: Orders

## 2022-05-13 ENCOUNTER — Other Ambulatory Visit: Payer: Self-pay

## 2022-05-21 NOTE — Patient Instructions (Signed)

## 2022-05-25 ENCOUNTER — Encounter: Payer: Self-pay | Admitting: Nurse Practitioner

## 2022-05-25 ENCOUNTER — Ambulatory Visit: Payer: Medicare Other | Admitting: Nurse Practitioner

## 2022-05-25 DIAGNOSIS — R7309 Other abnormal glucose: Secondary | ICD-10-CM

## 2022-05-25 NOTE — Progress Notes (Signed)
BP 111/76   Pulse 79   Temp 98.2 F (36.8 C) (Oral)   Ht 5' 7"  (1.702 m)   Wt 256 lb 11.2 oz (116.4 kg)   LMP  (LMP Unknown)   BMI 40.20 kg/m    Subjective:    Patient ID: Wanda White, female    DOB: December 18, 1945, 76 y.o.   MRN: [id number]  HPI: Wanda White is a 76 y.o. female  Chief Complaint  Patient presents with   Weight Check    Patient is here for a six week follow up on Weight Check.    Prediabetes   Medication Problem    Patient says she does not like the Metformin as it is causing swelling ankles, stomach-aches and stomach cramping, and loss of appetite. Patient says some days she is sleepy and not sleepy, so it is throwing off her sleep pattern.    PREDIABETES Started her on Metformin recently 500 MG BID due to A1c 6.2% and wishes to lose weight.  Does not like Metformin due to stomach cramps, nausea, stool frequency, and increased swelling to ankles.   Polydipsia/polyuria: no Visual disturbance: no Chest pain: no Paresthesias: no  Relevant past medical, surgical, family and social history reviewed and updated as indicated. Interim medical history since our last visit reviewed. Allergies and medications reviewed and updated.  Review of Systems  Constitutional:  Negative for activity change, chills, fatigue and fever.  HENT: Negative.    Respiratory:  Negative for cough, chest tightness and shortness of breath.   Cardiovascular:  Negative for chest pain, palpitations and leg swelling.  Gastrointestinal:  Negative for abdominal distention, constipation, nausea and vomiting.  Endocrine: Negative for cold intolerance, heat intolerance, polydipsia, polyphagia and polyuria.  Musculoskeletal: Negative.   Allergic/Immunologic: Negative.   Neurological:  Negative for dizziness, weakness and headaches.  Psychiatric/Behavioral: Negative.      Per HPI unless specifically indicated above     Objective:    BP 111/76   Pulse 79   Temp 98.2 F (36.8 C) (Oral)    Ht 5' 7"  (1.702 m)   Wt 256 lb 11.2 oz (116.4 kg)   LMP  (LMP Unknown)   BMI 40.20 kg/m   Wt Readings from Last 3 Encounters:  05/25/22 256 lb 11.2 oz (116.4 kg)  04/13/22 257 lb (116.6 kg)  09/08/21 256 lb 12.8 oz (116.5 kg)    Physical Exam Vitals and nursing note reviewed.  Constitutional:      General: She is awake. She is not in acute distress.    Appearance: She is well-developed. She is obese. She is not ill-appearing.  HENT:     Head: Normocephalic and atraumatic.     Right Ear: Hearing normal.     Left Ear: Hearing normal.     Mouth/Throat:     Mouth: Mucous membranes are moist.  Eyes:     General: Lids are normal.        Right eye: No discharge.        Left eye: No discharge.     Conjunctiva/sclera: Conjunctivae normal.     Pupils: Pupils are equal, round, and reactive to light.  Neck:     Thyroid: No thyromegaly or thyroid tenderness.     Vascular: No carotid bruit.  Cardiovascular:     Rate and Rhythm: Normal rate and regular rhythm.     Heart sounds: Normal heart sounds. No murmur heard.    No gallop.  Pulmonary:     Effort:  Pulmonary effort is normal. No accessory muscle usage or respiratory distress.     Breath sounds: Normal breath sounds.  Abdominal:     General: Bowel sounds are normal.     Palpations: Abdomen is soft.     Tenderness: There is no abdominal tenderness.  Musculoskeletal:     Cervical back: Normal range of motion and neck supple.     Right lower leg: No edema.     Left lower leg: No edema.  Lymphadenopathy:     Head:     Right side of head: No submental, submandibular, tonsillar, preauricular or posterior auricular adenopathy.     Left side of head: No submental, submandibular, tonsillar, preauricular or posterior auricular adenopathy.  Skin:    General: Skin is warm and dry.  Neurological:     General: No focal deficit present.     Mental Status: She is alert and oriented to person, place, and time.     Deep Tendon Reflexes:  Reflexes are normal and symmetric.  Psychiatric:        Attention and Perception: Attention normal.        Mood and Affect: Mood normal.        Speech: Speech normal.        Behavior: Behavior normal. Behavior is cooperative.        Thought Content: Thought content normal.        Judgment: Judgment normal.     Results for orders placed or performed in visit on 04/13/22  Bayer DCA Hb A1c Waived  Result Value Ref Range   HB A1C (BAYER DCA - WAIVED) 6.2 (H) 4.8 - 5.6 %  Microalbumin, Urine Waived  Result Value Ref Range   Microalb, Ur Waived 80 (H) 0 - 19 mg/L   Creatinine, Urine Waived 200 10 - 300 mg/dL   Microalb/Creat Ratio 30-300 (H) <30 mg/g  CBC with Differential/Platelet  Result Value Ref Range   WBC 3.9 3.4 - 10.8 x10E3/uL   RBC 4.56 3.77 - 5.28 x10E6/uL   Hemoglobin 13.9 11.1 - 15.9 g/dL   Hematocrit 41.4 34.0 - 46.6 %   MCV 91 79 - 97 fL   MCH 30.5 26.6 - 33.0 pg   MCHC 33.6 31.5 - 35.7 g/dL   RDW 11.8 11.7 - 15.4 %   Platelets 131 (L) 150 - 450 x10E3/uL   Neutrophils 39 Not Estab. %   Lymphs 38 Not Estab. %   Monocytes 20 Not Estab. %   Eos 1 Not Estab. %   Basos 1 Not Estab. %   Neutrophils Absolute 1.6 1.4 - 7.0 x10E3/uL   Lymphocytes Absolute 1.5 0.7 - 3.1 x10E3/uL   Monocytes Absolute 0.8 0.1 - 0.9 x10E3/uL   EOS (ABSOLUTE) 0.0 0.0 - 0.4 x10E3/uL   Basophils Absolute 0.0 0.0 - 0.2 x10E3/uL   Immature Granulocytes 1 Not Estab. %   Immature Grans (Abs) 0.0 0.0 - 0.1 x10E3/uL  Comprehensive metabolic panel  Result Value Ref Range   Glucose 109 (H) 70 - 99 mg/dL   BUN 13 8 - 27 mg/dL   Creatinine, Ser 1.06 (H) 0.57 - 1.00 mg/dL   eGFR 55 (L) >59 mL/min/1.73   BUN/Creatinine Ratio 12 12 - 28   Sodium 139 134 - 144 mmol/L   Potassium 4.4 3.5 - 5.2 mmol/L   Chloride 102 96 - 106 mmol/L   CO2 23 20 - 29 mmol/L   Calcium 9.7 8.7 - 10.3 mg/dL   Total Protein 7.4 6.0 -  8.5 g/dL   Albumin 4.7 3.8 - 4.8 g/dL   Globulin, Total 2.7 1.5 - 4.5 g/dL    Albumin/Globulin Ratio 1.7 1.2 - 2.2   Bilirubin Total 0.4 0.0 - 1.2 mg/dL   Alkaline Phosphatase 66 44 - 121 IU/L   AST 25 0 - 40 IU/L   ALT 23 0 - 32 IU/L  Lipid Panel w/o Chol/HDL Ratio  Result Value Ref Range   Cholesterol, Total 167 100 - 199 mg/dL   Triglycerides 130 0 - 149 mg/dL   HDL 49 >39 mg/dL   VLDL Cholesterol Cal 23 5 - 40 mg/dL   LDL Chol Calc (NIH) 95 0 - 99 mg/dL  TSH  Result Value Ref Range   TSH 1.830 0.450 - 4.500 uIU/mL  Thyroid peroxidase antibody  Result Value Ref Range   Thyroperoxidase Ab SerPl-aCnc 75 (H) 0 - 34 IU/mL  T4, free  Result Value Ref Range   Free T4 1.18 0.82 - 1.77 ng/dL      Assessment & Plan:   Problem List Items Addressed This Visit       Other   Elevated hemoglobin A1c measurement    Recent level 6.2%, we had a trial of Metformin to help lower level and weight loss -- she did not tolerate this -- GI symptoms.  Discussed at length with her other options including diet/exercise, Metformin XL, or Ozempic. Will trial diet and exercise for the next few months and recheck A1c in October -- recommended dietary, but refuses this.        Morbid obesity ([place]) - Primary    BMI 40.20 today. Recommended eating smaller high protein, low fat meals more frequently and exercising 30 mins a day 5 times a week with a goal of 10-15lb weight loss in the next 3 months. Patient voiced their understanding and motivation to adhere to these recommendations.          Follow up plan: Return in about 7 weeks (around 07/15/2022) for PREDIABETES.

## 2022-05-25 NOTE — Assessment & Plan Note (Signed)
Recent level 6.2%, we had a trial of Metformin to help lower level and weight loss -- she did not tolerate this -- GI symptoms.  Discussed at length with her other options including diet/exercise, Metformin XL, or Ozempic. Will trial diet and exercise for the next few months and recheck A1c in October -- recommended dietary, but refuses this.

## 2022-05-25 NOTE — Assessment & Plan Note (Signed)
BMI 40.20 today. Recommended eating smaller high protein, low fat meals more frequently and exercising 30 mins a day 5 times a week with a goal of 10-15lb weight loss in the next 3 months. Patient voiced their understanding and motivation to adhere to these recommendations.

## 2022-06-06 ENCOUNTER — Ambulatory Visit: Payer: Medicare Other

## 2022-06-17 ENCOUNTER — Ambulatory Visit
Admission: RE | Admit: 2022-06-17 | Discharge: 2022-06-17 | Disposition: A | Discharge status: Home / Self Care | Payer: Medicare Other | Source: Ambulatory Visit | Attending: Nurse Practitioner | Admitting: Nurse Practitioner

## 2022-06-17 DIAGNOSIS — Z1231 Encounter for screening mammogram for malignant neoplasm of breast: Secondary | ICD-10-CM | POA: Insufficient documentation

## 2022-06-17 DIAGNOSIS — M8588 Other specified disorders of bone density and structure, other site: Secondary | ICD-10-CM | POA: Insufficient documentation

## 2022-06-20 ENCOUNTER — Other Ambulatory Visit: Payer: Self-pay | Admitting: Nurse Practitioner

## 2022-06-20 DIAGNOSIS — N63 Unspecified lump in unspecified breast: Secondary | ICD-10-CM

## 2022-06-20 DIAGNOSIS — R928 Other abnormal and inconclusive findings on diagnostic imaging of breast: Secondary | ICD-10-CM

## 2022-06-20 NOTE — Progress Notes (Signed)
Contacted via Wanda White afternoon [name], your mammogram returned with some abnormal findings and you require repeat imaging.  Please call Adrian to schedule this.  Any questions?

## 2022-06-20 NOTE — Progress Notes (Signed)
Contacted via MyChart   Your bone density shows thinning bones (osteopenia) but not brittle (osteoporosis). We recommend Vitamin D supplementation of about 2,0000 IUs of over the counter Vitamin D3. In addition, we recommend a diet high in calcium with dairy and dark green leafy vegetables. We would like you to get plenty of weight bearing exercises with walking and resistance training such as light weights or resistance bands available with instructions at places such as [employer].

## 2022-07-06 ENCOUNTER — Ambulatory Visit (INDEPENDENT_AMBULATORY_CARE_PROVIDER_SITE_OTHER): Payer: Medicare Other | Admitting: *Deleted

## 2022-07-06 DIAGNOSIS — Z0000 Encounter for general adult medical examination without abnormal findings: Secondary | ICD-10-CM | POA: Diagnosis not present

## 2022-07-06 NOTE — Progress Notes (Signed)
Subjective:   Wanda White is a 76 y.o. female who presents for Medicare Annual (Subsequent) preventive examination.  I connected with  Jenayah Antu Arseneault on 07/06/22 by a telephone enabled telemedicine application and verified that I am speaking with the correct person using two identifiers.   I discussed the limitations of evaluation and management by telemedicine. The patient expressed understanding and agreed to proceed.  Patient location: home  Provider location:   Tele-health-home    Review of Systems     Cardiac Risk Factors include: advanced age (>73men, >74 women);obesity (BMI >30kg/m2);hypertension     Objective:    Today's Vitals   There is no height or weight on file to calculate BMI.     07/06/2022    9:07 AM 06/04/2021    9:47 AM 11/12/2018   10:56 AM 11/19/2017    9:07 AM 10/18/2017    9:57 AM 09/02/2016    9:11 AM  Advanced Directives  Does Patient Have a Medical Advance Directive? No No No Yes No No  Type of Advance Directive    Healthcare Power of Attorney    Does patient want to make changes to medical advance directive?   No - Patient declined     Would patient like information on creating a medical advance directive? No - Patient declined    No - Patient declined     Current Medications (verified) Outpatient Encounter Medications as of 07/06/2022  Medication Sig   aspirin EC 81 MG tablet Take 81 mg by mouth daily.   Boswellia-Glucosamine-Vit D (OSTEO BI-FLEX ONE PER DAY PO) Take 2 tablets by mouth daily.   Calcium Carb-Cholecalciferol (CALCIUM + D3) 600-200 MG-UNIT TABS Take 600 mg by mouth 2 (two) times daily. Reported on 01/19/2016   Cholecalciferol (VITAMIN D-3 PO) Take 1,000 Units by mouth daily. Reported on 01/19/2016   levothyroxine (SYNTHROID) 50 MCG tablet Take 1 tablet (50 mcg total) by mouth daily.   Magnesium 250 MG TABS Take 250 mg by mouth daily. Reported on 01/19/2016   Multiple Vitamin (MULTIVITAMIN) tablet Take 1 tablet by mouth daily.  Reported on 01/19/2016   Omega-3 Fatty Acids (FISH OIL) 1000 MG CAPS Take 1,000 mg by mouth 2 (two) times daily. Reported on 01/19/2016   simvastatin (ZOCOR) 40 MG tablet Take 1 tablet (40 mg total) by mouth at bedtime.   vitamin C (ASCORBIC ACID) 500 MG tablet Take 500 mg by mouth daily. Reported on 01/19/2016   vitamin E 400 UNIT capsule Take 400 Units by mouth daily. Reported on 01/19/2016   metFORMIN (GLUCOPHAGE) 500 MG tablet Take 1 tablet (500 mg total) by mouth 2 (two) times daily with a meal. (Patient not taking: Reported on 07/06/2022)   No facility-administered encounter medications on file as of 07/06/2022.    Allergies (verified) Amoxicillin   History: Past Medical History:  Diagnosis Date   Depression    Hyperlipidemia    Hypothyroid    Past Surgical History:  Procedure Laterality Date   CATARACT EXTRACTION  2004   JOINT REPLACEMENT  01/2016   left shoulder   SHOULDER SURGERY     THYROID SURGERY     gland removed   TUBAL LIGATION     Family History  Problem Relation Age of Onset   Cancer Mother 67       breast   Thyroid disease Mother    CAD Father    Hypertension Father    Heart attack Father    Heart attack Maternal Grandfather  Heart disease Paternal Grandmother    Lung disease Paternal Grandfather    Cancer Sister        breast   Hypertension Sister    Social History   Socioeconomic History   Marital status: Widowed    Spouse name: Not on file   Number of children: Not on file   Years of education: Not on file   Highest education level: High school graduate  Occupational History   Not on file  Tobacco Use   Smoking status: Former    Years: 20.00    Types: Cigarettes    Quit date: 09/26/2010    Years since quitting: 11.7   Smokeless tobacco: Never  Vaping Use   Vaping Use: Never used  Substance and Sexual Activity   Alcohol use: Yes    Comment: occasionally/socially   Drug use: No   Sexual activity: Not Currently  Other Topics Concern    Not on file  Social History Narrative   Not on file   Social Determinants of Health   Financial Resource Strain: Low Risk  (07/06/2022)   Overall Financial Resource Strain (CARDIA)    Difficulty of Paying Living Expenses: Not hard at all  Food Insecurity: No Food Insecurity (07/06/2022)   Hunger Vital Sign    Worried About Running Out of Food in the Last Year: Never true    [place] in the Last Year: Never true  Transportation Needs: No Transportation Needs (07/06/2022)   PRAPARE - Hydrologist (Medical): No    Lack of Transportation (Non-Medical): No  Physical Activity: Sufficiently Active (07/06/2022)   Exercise Vital Sign    Days of Exercise per Week: 4 days    Minutes of Exercise per Session: 50 min  Stress: No Stress Concern Present (07/06/2022)   [place]    Feeling of Stress : Not at all  Social Connections: Moderately Isolated (07/06/2022)   Social Connection and Isolation Panel [NHANES]    Frequency of Communication with Friends and Family: More than three times a week    Frequency of Social Gatherings with Friends and Family: More than three times a week    Attends Religious Services: 1 to 4 times per year    Active Member of [employer] or Organizations: No    Attends Archivist Meetings: Never    Marital Status: Widowed    Tobacco Counseling Counseling given: Not Answered   Clinical Intake:  Pre-visit preparation completed: Yes  Pain : No/denies pain        How often do you need to have someone help you when you read instructions, pamphlets, or other written materials from your doctor or pharmacy?: 1 - Never  Diabetic?  no  Interpreter Needed?: No  Information entered by :: Leroy Kennedy LPN   Activities of Daily Living    07/06/2022    9:11 AM  In your present state of health, do you have any difficulty performing the following  activities:  Hearing? 0  Vision? 0  Difficulty concentrating or making decisions? 0  Walking or climbing stairs? 0  Dressing or bathing? 0  Doing errands, shopping? 0  Preparing Food and eating ? N  Using the Toilet? N  In the past six months, have you accidently leaked urine? N  Do you have problems with loss of bowel control? N  Managing your Medications? N  Managing your Finances? N    Patient  Care Team: Marjie Skiff, NP as PCP - General (Nurse Practitioner)  Indicate any recent Medical Services you may have received from other than [facility] providers in the past year (date may be approximate).     Assessment:   This is a routine wellness examination for Glenna.  Hearing/Vision screen Hearing Screening - Comments:: No trouble hearing Vision Screening - Comments:: Up to date [facility]  Dietary issues and exercise activities discussed: Current Exercise Habits: Home exercise routine, Type of exercise: walking, Time (Minutes): 40, Frequency (Times/Week): 4, Weekly Exercise (Minutes/Week): 160, Intensity: Mild   Goals Addressed             This Visit's Progress    DIET - INCREASE WATER INTAKE   On track    Recommend drinking at last 6-8 glasses of water a day      Patient Stated       Stay alive       Depression Screen    07/06/2022    9:10 AM 05/25/2022    9:23 AM 06/04/2021    9:48 AM 01/30/2020    3:14 PM 11/12/2018   10:54 AM 10/18/2017    9:55 AM 09/02/2016    9:32 AM  PHQ 2/9 Scores  PHQ - 2 Score 0 0 0 0 0 0 0  PHQ- 9 Score 0 3    1     Fall Risk    07/06/2022    9:07 AM 05/25/2022    9:23 AM 06/04/2021    9:48 AM 01/30/2020    3:14 PM 11/12/2018   10:54 AM  Fall Risk   Falls in the past year? 0 0 0 0 0  Number falls in past yr: 0 0  0 0  Injury with Fall? 0 0  0 0  Risk for fall due to :  No Fall Risks Medication side effect    Follow up Falls evaluation completed;Education provided;Falls prevention discussed Falls evaluation completed Falls evaluation  completed;Education provided;Falls prevention discussed      FALL RISK PREVENTION PERTAINING TO THE HOME:  Any stairs in or around the home? Yes  If so, are there any without handrails? No  Home free of loose throw rugs in walkways, pet beds, electrical cords, etc? Yes  Adequate lighting in your home to reduce risk of falls? Yes   ASSISTIVE DEVICES UTILIZED TO PREVENT FALLS:  Life alert? No  Use of a cane, walker or w/c? No  Grab bars in the bathroom? Yes  Shower chair or bench in shower? No  Elevated toilet seat or a handicapped toilet? No   TIMED UP AND GO:  Was the test performed? No .    Cognitive Function:        07/06/2022    9:06 AM 06/04/2021    9:50 AM 11/12/2018   10:56 AM 10/18/2017   10:06 AM  6CIT Screen  What Year? 0 points 0 points 0 points 0 points  What month? 0 points 0 points 0 points 0 points  What time? 0 points 0 points 0 points 0 points  Count back from 20 0 points 0 points 0 points 0 points  Months in reverse 0 points 2 points 0 points 0 points  Repeat phrase 0 points 0 points 0 points 0 points  Total Score 0 points 2 points 0 points 0 points    Immunizations Immunization History  Administered Date(s) Administered   Fluad Quad(high Dose 65+) 07/12/2019, 06/22/2021   Influenza, High Dose Seasonal PF 07/29/2018,  07/23/2020   Influenza-Unspecified 11/21/2014, 08/22/2016, 07/23/2020   PFIZER(Purple Top)SARS-COV-2 Vaccination 11/13/2019, 12/04/2019, 07/23/2020   Pneumococcal Conjugate-13 03/18/2014   Pneumococcal Polysaccharide-23 04/02/2013   Td 02/10/2004   Tdap 07/28/2011    TDAP status: Due, Education has been provided regarding the importance of this vaccine. Advised may receive this vaccine at local pharmacy or Health Dept. Aware to provide a copy of the vaccination record if obtained from local pharmacy or Health Dept. Verbalized acceptance and understanding.  Flu Vaccine status: Due, Education has been provided regarding the importance  of this vaccine. Advised may receive this vaccine at local pharmacy or Health Dept. Aware to provide a copy of the vaccination record if obtained from local pharmacy or Health Dept. Verbalized acceptance and understanding.  Pneumococcal vaccine status: Up to date  Covid-19 vaccine status: Information provided on how to obtain vaccines.   Qualifies for Shingles Vaccine? Yes   Zostavax completed No   Shingrix Completed?: No.    Education has been provided regarding the importance of this vaccine. Patient has been advised to call insurance company to determine out of pocket expense if they have not yet received this vaccine. Advised may also receive vaccine at local pharmacy or Health Dept. Verbalized acceptance and understanding.  Screening Tests Health Maintenance  Topic Date Due   COVID-19 Vaccine (4 - Pfizer series) 09/17/2020   INFLUENZA VACCINE  04/26/2022   Zoster Vaccines- Shingrix (1 of 2) 10/06/2022 (Originally 01/08/1998)   COLONOSCOPY (Pts 45-69yrs Insurance coverage will need to be confirmed)  04/14/2023 (Originally 03/05/2022)   TETANUS/TDAP  04/14/2023 (Originally 07/27/2021)   MAMMOGRAM  06/17/2024   Pneumonia Vaccine 67+ Years old  Completed   DEXA SCAN  Completed   Hepatitis C Screening  Completed   HPV VACCINES  Aged Out    Health Maintenance  Health Maintenance Due  Topic Date Due   COVID-19 Vaccine (4 - Pfizer series) 09/17/2020   INFLUENZA VACCINE  04/26/2022    Colorectal cancer screening: Type of screening: Colonoscopy. Completed 2013. Repeat every 10 years   Patient discussed with doctor   Mammogram status: Completed  . Repeat every year  Bone Density status: Completed 2023. Results reflect: Bone density results: OSTEOPENIA. Repeat every 3 years.  Lung Cancer Screening: (Low Dose CT Chest recommended if Age 67-80 years, 30 pack-year currently smoking OR have quit w/in 15years.) does not qualify.   Lung Cancer Screening Referral:   Additional  Screening:  Hepatitis C Screening: does not qualify; Completed 2020  Vision Screening: Recommended annual ophthalmology exams for early detection of glaucoma and other disorders of the eye. Is the patient up to date with their annual eye exam?  Yes  Who is the provider or what is the name of the office in which the patient attends annual eye exams? [facility] If pt is not established with a provider, would they like to be referred to a provider to establish care? No .   Dental Screening: Recommended annual dental exams for proper oral hygiene  Community Resource Referral / Chronic Care Management: CRR required this visit?  No   CCM required this visit?  No      Plan:     I have personally reviewed and noted the following in the patient's chart:   Medical and social history Use of alcohol, tobacco or illicit drugs  Current medications and supplements including opioid prescriptions. Patient is not currently taking opioid prescriptions. Functional ability and status Nutritional status Physical activity Advanced directives List of other physicians  Hospitalizations, surgeries, and ER visits in previous 12 months Vitals Screenings to include cognitive, depression, and falls Referrals and appointments  In addition, I have reviewed and discussed with patient certain preventive protocols, quality metrics, and best practice recommendations. A written personalized care plan for preventive services as well as general preventive health recommendations were provided to patient.     Remi HaggardJulie Selisa Tensley, LPN   16/10/960410/07/2022   Nurse Notes:   patient has stopped taking Metformin,  upset her stomach.  Will discuss at upcoming appointment

## 2022-07-06 NOTE — Patient Instructions (Signed)
Ms. Wanda White , Thank you for taking time to come for your Medicare Wellness Visit. I appreciate your ongoing commitment to your health goals. Please review the following plan we discussed and let me know if I can assist you in the future.   These are the goals we discussed:  Goals      DIET - INCREASE WATER INTAKE     Recommend drinking at last 6-8 glasses of water a day      Patient Stated     06/04/2021, wants to weigh 230 pounds     Patient Stated     Stay alive        This is a list of the screening recommended for you and due dates:  Health Maintenance  Topic Date Due   COVID-19 Vaccine (4 - Pfizer series) 09/17/2020   Flu Shot  04/26/2022   Zoster (Shingles) Vaccine (1 of 2) 10/06/2022*   Colon Cancer Screening  04/14/2023*   Tetanus Vaccine  04/14/2023*   Mammogram  06/17/2024   Pneumonia Vaccine  Completed   DEXA scan (bone density measurement)  Completed   Hepatitis C Screening: USPSTF Recommendation to screen - Ages 29-79 yo.  Completed   HPV Vaccine  Aged Out  *Topic was postponed. The date shown is not the original due date.    Advanced directives: Education provided  Conditions/risks identified:   Next appointment: Follow up in one year for your annual wellness visit 07-15-2022 @ 9:20  [place] 65 Years and Older, Female Preventive care refers to lifestyle choices and visits with your health care provider that can promote health and wellness. What does preventive care include? A yearly physical exam. This is also called an annual well check. Dental exams once or twice a year. Routine eye exams. Ask your health care provider how often you should have your eyes checked. Personal lifestyle choices, including: Daily care of your teeth and gums. Regular physical activity. Eating a healthy diet. Avoiding tobacco and drug use. Limiting alcohol use. Practicing safe sex. Taking low-dose aspirin every day. Taking vitamin and mineral supplements  as recommended by your health care provider. What happens during an annual well check? The services and screenings done by your health care provider during your annual well check will depend on your age, overall health, lifestyle risk factors, and family history of disease. Counseling  Your health care provider may ask you questions about your: Alcohol use. Tobacco use. Drug use. Emotional well-being. Home and relationship well-being. Sexual activity. Eating habits. History of falls. Memory and ability to understand (cognition). Work and work Statistician. Reproductive health. Screening  You may have the following tests or measurements: Height, weight, and BMI. Blood pressure. Lipid and cholesterol levels. These may be checked every 5 years, or more frequently if you are over 25 years old. Skin check. Lung cancer screening. You may have this screening every year starting at age 79 if you have a 30-pack-year history of smoking and currently smoke or have quit within the past 15 years. Fecal occult blood test (FOBT) of the stool. You may have this test every year starting at age 2. Flexible sigmoidoscopy or colonoscopy. You may have a sigmoidoscopy every 5 years or a colonoscopy every 10 years starting at age 70. Hepatitis C blood test. Hepatitis B blood test. Sexually transmitted disease (STD) testing. Diabetes screening. This is done by checking your blood sugar (glucose) after you have not eaten for a while (fasting). You may have this done  every 1-3 years. Bone density scan. This is done to screen for osteoporosis. You may have this done starting at age 82. Mammogram. This may be done every 1-2 years. Talk to your health care provider about how often you should have regular mammograms. Talk with your health care provider about your test results, treatment options, and if necessary, the need for more tests. Vaccines  Your health care provider may recommend certain vaccines, such  as: Influenza vaccine. This is recommended every year. Tetanus, diphtheria, and acellular pertussis (Tdap, Td) vaccine. You may need a Td booster every 10 years. Zoster vaccine. You may need this after age 53. Pneumococcal 13-valent conjugate (PCV13) vaccine. One dose is recommended after age 88. Pneumococcal polysaccharide (PPSV23) vaccine. One dose is recommended after age 7. Talk to your health care provider about which screenings and vaccines you need and how often you need them. This information is not intended to replace advice given to you by your health care provider. Make sure you discuss any questions you have with your health care provider. Document Released: 10/09/2015 Document Revised: 06/01/2016 Document Reviewed: 07/14/2015 Elsevier Interactive Patient Education  2017 [place] Prevention in the Home Falls can cause injuries. They can happen to people of all ages. There are many things you can do to make your home safe and to help prevent falls. What can I do on the outside of my home? Regularly fix the edges of walkways and driveways and fix any cracks. Remove anything that might make you trip as you walk through a door, such as a raised step or threshold. Trim any bushes or trees on the path to your home. Use bright outdoor lighting. Clear any walking paths of anything that might make someone trip, such as rocks or tools. Regularly check to see if handrails are loose or broken. Make sure that both sides of any steps have handrails. Any raised decks and porches should have guardrails on the edges. Have any leaves, snow, or ice cleared regularly. Use sand or salt on walking paths during winter. Clean up any spills in your garage right away. This includes oil or grease spills. What can I do in the bathroom? Use night lights. Install grab bars by the toilet and in the tub and shower. Do not use towel bars as grab bars. Use non-skid mats or decals in the tub or  shower. If you need to sit down in the shower, use a plastic, non-slip stool. Keep the floor dry. Clean up any water that spills on the floor as soon as it happens. Remove soap buildup in the tub or shower regularly. Attach bath mats securely with double-sided non-slip rug tape. Do not have throw rugs and other things on the floor that can make you trip. What can I do in the bedroom? Use night lights. Make sure that you have a light by your bed that is easy to reach. Do not use any sheets or blankets that are too big for your bed. They should not hang down onto the floor. Have a firm chair that has side arms. You can use this for support while you get dressed. Do not have throw rugs and other things on the floor that can make you trip. What can I do in the kitchen? Clean up any spills right away. Avoid walking on wet floors. Keep items that you use a lot in easy-to-reach places. If you need to reach something above you, use a strong step stool that has  a grab bar. Keep electrical cords out of the way. Do not use floor polish or wax that makes floors slippery. If you must use wax, use non-skid floor wax. Do not have throw rugs and other things on the floor that can make you trip. What can I do with my stairs? Do not leave any items on the stairs. Make sure that there are handrails on both sides of the stairs and use them. Fix handrails that are broken or loose. Make sure that handrails are as long as the stairways. Check any carpeting to make sure that it is firmly attached to the stairs. Fix any carpet that is loose or worn. Avoid having throw rugs at the top or bottom of the stairs. If you do have throw rugs, attach them to the floor with carpet tape. Make sure that you have a light switch at the top of the stairs and the bottom of the stairs. If you do not have them, ask someone to add them for you. What else can I do to help prevent falls? Wear shoes that: Do not have high heels. Have  rubber bottoms. Are comfortable and fit you well. Are closed at the toe. Do not wear sandals. If you use a stepladder: Make sure that it is fully opened. Do not climb a closed stepladder. Make sure that both sides of the stepladder are locked into place. Ask someone to hold it for you, if possible. Clearly mark and make sure that you can see: Any grab bars or handrails. First and last steps. Where the edge of each step is. Use tools that help you move around (mobility aids) if they are needed. These include: Canes. Walkers. Scooters. Crutches. Turn on the lights when you go into a dark area. Replace any light bulbs as soon as they burn out. Set up your furniture so you have a clear path. Avoid moving your furniture around. If any of your floors are uneven, fix them. If there are any pets around you, be aware of where they are. Review your medicines with your doctor. Some medicines can make you feel dizzy. This can increase your chance of falling. Ask your doctor what other things that you can do to help prevent falls. This information is not intended to replace advice given to you by your health care provider. Make sure you discuss any questions you have with your health care provider. Document Released: 07/09/2009 Document Revised: 02/18/2016 Document Reviewed: 10/17/2014 Elsevier Interactive Patient Education  2017 [employer].

## 2022-07-10 NOTE — Patient Instructions (Signed)

## 2022-07-15 ENCOUNTER — Ambulatory Visit: Payer: Medicare Other | Admitting: Nurse Practitioner

## 2022-07-15 ENCOUNTER — Encounter: Payer: Self-pay | Admitting: Nurse Practitioner

## 2022-07-15 DIAGNOSIS — R7309 Other abnormal glucose: Secondary | ICD-10-CM

## 2022-07-15 DIAGNOSIS — D696 Thrombocytopenia, unspecified: Secondary | ICD-10-CM

## 2022-07-15 DIAGNOSIS — E782 Mixed hyperlipidemia: Secondary | ICD-10-CM

## 2022-07-15 DIAGNOSIS — Z23 Encounter for immunization: Secondary | ICD-10-CM | POA: Diagnosis not present

## 2022-07-15 DIAGNOSIS — I10 Essential (primary) hypertension: Secondary | ICD-10-CM

## 2022-07-15 DIAGNOSIS — N1831 Chronic kidney disease, stage 3a: Secondary | ICD-10-CM | POA: Diagnosis not present

## 2022-07-15 LAB — BAYER DCA HB A1C WAIVED: HB A1C (BAYER DCA - WAIVED): 5.7 % — ABNORMAL HIGH (ref 4.8–5.6)

## 2022-07-15 NOTE — Assessment & Plan Note (Signed)
Noted on previous labs, she does take low dose daily ASA. Recheck CBC today. Recommend she change ASA to three days a week.

## 2022-07-15 NOTE — Assessment & Plan Note (Signed)
A1c improving today at 5.7%, remains in prediabetic range but trending down.  Recommend continue diet and activity focus at home.

## 2022-07-15 NOTE — Assessment & Plan Note (Addendum)
Chronic, stable without medication. BP at goal in office and at home.  Recommend she monitor BP at least a few mornings a week at home and document.  DASH diet at home.  Continue current medication regimen and adjust as needed.  Labs today: BMP and CBC.  Return in 6 months.

## 2022-07-15 NOTE — Assessment & Plan Note (Signed)
BMI 40.06.  Recommended eating smaller high protein, low fat meals more frequently and exercising 30 mins a day 5 times a week with a goal of 10-15lb weight loss in the next 3 months. Patient voiced their understanding and motivation to adhere to these recommendations. ° °

## 2022-07-15 NOTE — Progress Notes (Signed)
BP 123/71   Pulse 77   Temp 98 F (36.7 C) (Oral)   Ht 5' 7.5" (1.715 m)   Wt 259 lb 9.6 oz (117.8 kg)   LMP  (LMP Unknown)   SpO2 96%   BMI 40.06 kg/m    Subjective:    Patient ID: Wanda LICKLIDER, female    DOB: 1945-11-05, 76 y.o.   MRN: [id number]  HPI: Wanda White is a 76 y.o. female  Chief Complaint  Patient presents with   Diabetes   Hyperlipidemia   Hypertension   PREDIABETES A1c 6.2% July and wishes to lose weight. We tried Metformin, but this made her nauseous. Polydipsia/polyuria: no Visual disturbance: no Chest pain: no Paresthesias: no  HYPERTENSION / HYPERLIPIDEMIA Continues on ASA, Simvastatin, and fish oil.  No BP medications. Satisfied with current treatment? yes Duration of hypertension: chronic BP monitoring frequency: daily BP range: 120/70-80 on average BP medication side effects: no Duration of hyperlipidemia: chronic Cholesterol medication side effects: no Cholesterol supplements: none Medication compliance: good compliance Aspirin: yes Recent stressors: no Recurrent headaches: no Visual changes: no Palpitations: no Dyspnea: no Chest pain: no Lower extremity edema: no Dizzy/lightheaded: no  The 10-year ASCVD risk score (Arnett DK, et al., 2019) is: 13.2%   Values used to calculate the score:     Age: 70 years     Sex: Female     Is Non-Hispanic African American: No     Diabetic: No     Tobacco smoker: No     Systolic Blood Pressure: 062 mmHg     Is BP treated: No     HDL Cholesterol: 49 mg/dL     Total Cholesterol: 167 mg/dL   CHRONIC KIDNEY DISEASE Remaining stable with CKD 3a on past labs. CKD status: stable Medications renally dose: yes Previous renal evaluation: no Pneumovax:  Up to Date Influenza Vaccine:  Up to Date   Relevant past medical, surgical, family and social history reviewed and updated as indicated. Interim medical history since our last visit reviewed. Allergies and medications reviewed and  updated.  Review of Systems  Constitutional:  Negative for activity change, appetite change, diaphoresis, fatigue and fever.  Respiratory:  Negative for cough, chest tightness and shortness of breath.   Cardiovascular:  Negative for chest pain, palpitations and leg swelling.  Gastrointestinal: Negative.   Endocrine: Negative for cold intolerance and heat intolerance.  Neurological: Negative.   Psychiatric/Behavioral: Negative.      Per HPI unless specifically indicated above     Objective:    BP 123/71   Pulse 77   Temp 98 F (36.7 C) (Oral)   Ht 5' 7.5" (1.715 m)   Wt 259 lb 9.6 oz (117.8 kg)   LMP  (LMP Unknown)   SpO2 96%   BMI 40.06 kg/m   Wt Readings from Last 3 Encounters:  07/15/22 259 lb 9.6 oz (117.8 kg)  05/25/22 256 lb 11.2 oz (116.4 kg)  04/13/22 257 lb (116.6 kg)    Physical Exam Vitals and nursing note reviewed.  Constitutional:      General: She is awake. She is not in acute distress.    Appearance: She is well-developed. She is obese. She is not ill-appearing.  HENT:     Head: Normocephalic and atraumatic.     Right Ear: Hearing normal.     Left Ear: Hearing normal.     Mouth/Throat:     Mouth: Mucous membranes are moist.  Eyes:     General:  Lids are normal.        Right eye: No discharge.        Left eye: No discharge.     Conjunctiva/sclera: Conjunctivae normal.     Pupils: Pupils are equal, round, and reactive to light.  Neck:     Thyroid: No thyromegaly or thyroid tenderness.     Vascular: No carotid bruit.  Cardiovascular:     Rate and Rhythm: Normal rate and regular rhythm.     Heart sounds: Normal heart sounds. No murmur heard.    No gallop.  Pulmonary:     Effort: Pulmonary effort is normal. No accessory muscle usage or respiratory distress.     Breath sounds: Normal breath sounds.  Abdominal:     General: Bowel sounds are normal.     Palpations: Abdomen is soft.     Tenderness: There is no abdominal tenderness.  Musculoskeletal:      Cervical back: Normal range of motion and neck supple.     Right lower leg: No edema.     Left lower leg: No edema.  Skin:    General: Skin is warm and dry.  Neurological:     General: No focal deficit present.     Mental Status: She is alert and oriented to person, place, and time.     Deep Tendon Reflexes: Reflexes are normal and symmetric.  Psychiatric:        Attention and Perception: Attention normal.        Mood and Affect: Mood normal.        Speech: Speech normal.        Behavior: Behavior normal. Behavior is cooperative.        Thought Content: Thought content normal.        Judgment: Judgment normal.    Results for orders placed or performed in visit on 04/13/22  Bayer DCA Hb A1c Waived  Result Value Ref Range   HB A1C (BAYER DCA - WAIVED) 6.2 (H) 4.8 - 5.6 %  Microalbumin, Urine Waived  Result Value Ref Range   Microalb, Ur Waived 80 (H) 0 - 19 mg/L   Creatinine, Urine Waived 200 10 - 300 mg/dL   Microalb/Creat Ratio 30-300 (H) <30 mg/g  CBC with Differential/Platelet  Result Value Ref Range   WBC 3.9 3.4 - 10.8 x10E3/uL   RBC 4.56 3.77 - 5.28 x10E6/uL   Hemoglobin 13.9 11.1 - 15.9 g/dL   Hematocrit 41.4 34.0 - 46.6 %   MCV 91 79 - 97 fL   MCH 30.5 26.6 - 33.0 pg   MCHC 33.6 31.5 - 35.7 g/dL   RDW 11.8 11.7 - 15.4 %   Platelets 131 (L) 150 - 450 x10E3/uL   Neutrophils 39 Not Estab. %   Lymphs 38 Not Estab. %   Monocytes 20 Not Estab. %   Eos 1 Not Estab. %   Basos 1 Not Estab. %   Neutrophils Absolute 1.6 1.4 - 7.0 x10E3/uL   Lymphocytes Absolute 1.5 0.7 - 3.1 x10E3/uL   Monocytes Absolute 0.8 0.1 - 0.9 x10E3/uL   EOS (ABSOLUTE) 0.0 0.0 - 0.4 x10E3/uL   Basophils Absolute 0.0 0.0 - 0.2 x10E3/uL   Immature Granulocytes 1 Not Estab. %   Immature Grans (Abs) 0.0 0.0 - 0.1 x10E3/uL  Comprehensive metabolic panel  Result Value Ref Range   Glucose 109 (H) 70 - 99 mg/dL   BUN 13 8 - 27 mg/dL   Creatinine, Ser 1.06 (H) 0.57 - 1.00 mg/dL  eGFR 55 (L) >59  mL/min/1.73   BUN/Creatinine Ratio 12 12 - 28   Sodium 139 134 - 144 mmol/L   Potassium 4.4 3.5 - 5.2 mmol/L   Chloride 102 96 - 106 mmol/L   CO2 23 20 - 29 mmol/L   Calcium 9.7 8.7 - 10.3 mg/dL   Total Protein 7.4 6.0 - 8.5 g/dL   Albumin 4.7 3.8 - 4.8 g/dL   Globulin, Total 2.7 1.5 - 4.5 g/dL   Albumin/Globulin Ratio 1.7 1.2 - 2.2   Bilirubin Total 0.4 0.0 - 1.2 mg/dL   Alkaline Phosphatase 66 44 - 121 IU/L   AST 25 0 - 40 IU/L   ALT 23 0 - 32 IU/L  Lipid Panel w/o Chol/HDL Ratio  Result Value Ref Range   Cholesterol, Total 167 100 - 199 mg/dL   Triglycerides 130 0 - 149 mg/dL   HDL 49 >39 mg/dL   VLDL Cholesterol Cal 23 5 - 40 mg/dL   LDL Chol Calc (NIH) 95 0 - 99 mg/dL  TSH  Result Value Ref Range   TSH 1.830 0.450 - 4.500 uIU/mL  Thyroid peroxidase antibody  Result Value Ref Range   Thyroperoxidase Ab SerPl-aCnc 75 (H) 0 - 34 IU/mL  T4, free  Result Value Ref Range   Free T4 1.18 0.82 - 1.77 ng/dL      Assessment & Plan:   Problem List Items Addressed This Visit       Cardiovascular and Mediastinum   Hypertension    Chronic, stable without medication. BP at goal in office and at home.  Recommend she monitor BP at least a few mornings a week at home and document.  DASH diet at home.  Continue current medication regimen and adjust as needed.  Labs today: BMP and CBC.  Return in 6 months.         Genitourinary   CKD (chronic kidney disease) stage 3, GFR 30-59 ml/min (HCC)    Chronic, stable. Continue to monitor on labs. Consider adding Lisinopril or Losartan in the future for kidney protection. BMP today.  Could consider Wilder Glade or ACE/ARB in future, discussed with patient.      Relevant Orders   Basic metabolic panel   CBC with Differential/Platelet     Hematopoietic and Hemostatic   Thrombocytopenia ([place])    Noted on previous labs, she does take low dose daily ASA. Recheck CBC today. Recommend she change ASA to three days a week.      Relevant Orders    CBC with Differential/Platelet     Other   Elevated hemoglobin A1c measurement    A1c improving today at 5.7%, remains in prediabetic range but trending down.  Recommend continue diet and activity focus at home.      Relevant Orders   Bayer DCA Hb A1c Waived   Hyperlipidemia    Chronic, ongoing. Continue Simvastatin and adjust as needed. Lipid panel up to date.      Morbid obesity ([place]) - Primary    BMI 40.06. Recommended eating smaller high protein, low fat meals more frequently and exercising 30 mins a day 5 times a week with a goal of 10-15lb weight loss in the next 3 months. Patient voiced their understanding and motivation to adhere to these recommendations.        Other Visit Diagnoses     Flu vaccine need       Flu vaccine in office today.   Relevant Orders   Flu Vaccine QUAD High Dose(Fluad) (  Completed)        Follow up plan: Return in about 6 months (around 01/14/2023) for PREDIABETES, HTN/HLD, OSTEOPENIA, CKD.

## 2022-07-15 NOTE — Assessment & Plan Note (Addendum)
Chronic, ongoing. Continue Simvastatin and adjust as needed. Lipid panel up to date.

## 2022-07-15 NOTE — Assessment & Plan Note (Signed)
Chronic, stable. Continue to monitor on labs. Consider adding Lisinopril or Losartan in the future for kidney protection. BMP today.  Could consider Wilder Glade or ACE/ARB in future, discussed with patient.

## 2022-07-16 LAB — CBC WITH DIFFERENTIAL/PLATELET
Basophils Absolute: 0 10*3/uL (ref 0.0–0.2)
Basos: 1 %
EOS (ABSOLUTE): 0 10*3/uL (ref 0.0–0.4)
Eos: 0 %
Hematocrit: 40.3 % (ref 34.0–46.6)
Hemoglobin: 13.4 g/dL (ref 11.1–15.9)
Immature Grans (Abs): 0 10*3/uL (ref 0.0–0.1)
Immature Granulocytes: 1 %
Lymphocytes Absolute: 1.9 10*3/uL (ref 0.7–3.1)
Lymphs: 43 %
MCH: 30 pg (ref 26.6–33.0)
MCHC: 33.3 g/dL (ref 31.5–35.7)
MCV: 90 fL (ref 79–97)
Monocytes Absolute: 0.8 10*3/uL (ref 0.1–0.9)
Monocytes: 19 %
Neutrophils Absolute: 1.6 10*3/uL (ref 1.4–7.0)
Neutrophils: 36 %
Platelets: 141 10*3/uL — ABNORMAL LOW (ref 150–450)
RBC: 4.47 x10E6/uL (ref 3.77–5.28)
RDW: 12.2 % (ref 11.7–15.4)
WBC: 4.3 10*3/uL (ref 3.4–10.8)

## 2022-07-16 LAB — BASIC METABOLIC PANEL
BUN/Creatinine Ratio: 11 — ABNORMAL LOW (ref 12–28)
BUN: 12 mg/dL (ref 8–27)
CO2: 22 mmol/L (ref 20–29)
Calcium: 9.8 mg/dL (ref 8.7–10.3)
Chloride: 100 mmol/L (ref 96–106)
Creatinine, Ser: 1.07 mg/dL — ABNORMAL HIGH (ref 0.57–1.00)
Glucose: 113 mg/dL — ABNORMAL HIGH (ref 70–99)
Potassium: 4 mmol/L (ref 3.5–5.2)
Sodium: 137 mmol/L (ref 134–144)
eGFR: 55 mL/min/{1.73_m2} — ABNORMAL LOW (ref >59)

## 2022-07-17 NOTE — Progress Notes (Signed)
Contacted via [place] [facility], your labs have returned: - Kidney function continues to show mild kidney disease Stage 3a with no worsening.  We will continue to monitor at visits and as discussed may start you on kidney protective medication in future to help prevent from worsening. - CBC continues to show very mild low platelets -- definitely go to every other day Aspirin or even three days a week.  Any questions? Keep being stellar!!  Thank you for allowing me to participate in your care.  I appreciate you. Kindest regards, Nashly Olsson

## 2022-07-22 ENCOUNTER — Ambulatory Visit
Admission: RE | Admit: 2022-07-22 | Discharge: 2022-07-22 | Disposition: A | Discharge status: Home / Self Care | Payer: Medicare Other | Source: Ambulatory Visit | Attending: Nurse Practitioner | Admitting: Nurse Practitioner

## 2022-07-22 DIAGNOSIS — N63 Unspecified lump in unspecified breast: Secondary | ICD-10-CM

## 2022-07-22 DIAGNOSIS — R928 Other abnormal and inconclusive findings on diagnostic imaging of breast: Secondary | ICD-10-CM | POA: Diagnosis present

## 2023-01-18 ENCOUNTER — Ambulatory Visit: Payer: Medicare Other | Admitting: Nurse Practitioner

## 2023-02-16 ENCOUNTER — Ambulatory Visit: Payer: Medicare Other | Admitting: Nurse Practitioner

## 2023-03-15 ENCOUNTER — Ambulatory Visit: Payer: Medicare Other | Admitting: Nurse Practitioner

## 2023-03-15 DIAGNOSIS — I10 Essential (primary) hypertension: Secondary | ICD-10-CM

## 2023-03-15 DIAGNOSIS — D696 Thrombocytopenia, unspecified: Secondary | ICD-10-CM

## 2023-03-15 DIAGNOSIS — E782 Mixed hyperlipidemia: Secondary | ICD-10-CM

## 2023-03-15 DIAGNOSIS — E063 Autoimmune thyroiditis: Secondary | ICD-10-CM

## 2023-03-15 DIAGNOSIS — M8588 Other specified disorders of bone density and structure, other site: Secondary | ICD-10-CM

## 2023-03-15 DIAGNOSIS — E559 Vitamin D deficiency, unspecified: Secondary | ICD-10-CM

## 2023-03-15 DIAGNOSIS — N1831 Chronic kidney disease, stage 3a: Secondary | ICD-10-CM

## 2023-03-15 DIAGNOSIS — R7309 Other abnormal glucose: Secondary | ICD-10-CM

## 2023-04-02 NOTE — Patient Instructions (Incomplete)
Be Involved in Caring For Your Health:  Taking Medications When medications are taken as directed, they can greatly improve your health. But if they are not taken as prescribed, they may not work. In some cases, not taking them correctly can be harmful. To help ensure your treatment remains effective and safe, understand your medications and how to take them. Bring your medications to each visit for review by your provider.  Your lab results, notes, and after visit summary will be available on My Chart. We strongly encourage you to use this feature. If lab results are abnormal the clinic will contact you with the appropriate steps. If the clinic does not contact you assume the results are satisfactory. You can always view your results on My Chart. If you have questions regarding your health or results, please contact the clinic during office hours. You can also ask questions on My Chart.  We at [facility] are grateful that you chose us to provide your care. We strive to provide evidence-based and compassionate care and are always looking for feedback. If you get a survey from the clinic please complete this so we can hear your opinions.  DASH Eating Plan DASH stands for Dietary Approaches to Stop Hypertension. The DASH eating plan is a healthy eating plan that has been shown to: Lower high blood pressure (hypertension). Reduce your risk for type 2 diabetes, heart disease, and stroke. Help with weight loss. What are tips for following this plan? Reading food labels Check food labels for the amount of salt (sodium) per serving. Choose foods with less than 5 percent of the Daily Value (DV) of sodium. In general, foods with less than 300 milligrams (mg) of sodium per serving fit into this eating plan. To find whole grains, look for the word "whole" as the first word in the ingredient list. Shopping Buy products labeled as "low-sodium" or "no salt added." Buy fresh foods. Avoid canned  foods and pre-made or frozen meals. Cooking Try not to add salt when you cook. Use salt-free seasonings or herbs instead of table salt or sea salt. Check with your health care provider or pharmacist before using salt substitutes. Do not fry foods. Cook foods in healthy ways, such as baking, boiling, grilling, roasting, or broiling. Cook using oils that are good for your heart. These include olive, canola, avocado, soybean, and sunflower oil. Meal planning  Eat a balanced diet. This should include: 4 or more servings of fruits and 4 or more servings of vegetables each day. Try to fill half of your plate with fruits and vegetables. 6-8 servings of whole grains each day. 6 or less servings of lean meat, poultry, or fish each day. 1 oz is 1 serving. A 3 oz (85 g) serving of meat is about the same size as the palm of your hand. One egg is 1 oz (28 g). 2-3 servings of low-fat dairy each day. One serving is 1 cup (237 mL). 1 serving of nuts, seeds, or beans 5 times each week. 2-3 servings of heart-healthy fats. Healthy fats called omega-3 fatty acids are found in foods such as walnuts, flaxseeds, fortified milks, and eggs. These fats are also found in cold-water fish, such as sardines, salmon, and mackerel. Limit how much you eat of: Canned or prepackaged foods. Food that is high in trans fat, such as fried foods. Food that is high in saturated fat, such as fatty meat. Desserts and other sweets, sugary drinks, and other foods with added sugar. Full-fat   dairy products. Do not salt foods before eating. Do not eat more than 4 egg yolks a week. Try to eat at least 2 vegetarian meals a week. Eat more home-cooked food and less restaurant, buffet, and fast food. Lifestyle When eating at a restaurant, ask if your food can be made with less salt or no salt. If you drink alcohol: Limit how much you have to: 0-1 drink a day if you are female. 0-2 drinks a day if you are female. Know how much alcohol is in  your drink. In the [place] one drink is one 12 oz bottle of beer (355 mL), one 5 oz glass of wine (148 mL), or one 1 oz glass of hard liquor (44 mL). General information Avoid eating more than 2,300 mg of salt a day. If you have hypertension, you may need to reduce your sodium intake to 1,500 mg a day. Work with your provider to stay at a healthy body weight or lose weight. Ask what the best weight range is for you. On most days of the week, get at least 30 minutes of exercise that causes your heart to beat faster. This may include walking, swimming, or biking. Work with your provider or dietitian to adjust your eating plan to meet your specific calorie needs. What foods should I eat? Fruits All fresh, dried, or frozen fruit. Canned fruits that are in their natural juice and do not have sugar added to them. Vegetables Fresh or frozen vegetables that are raw, steamed, roasted, or grilled. Low-sodium or reduced-sodium tomato and vegetable juice. Low-sodium or reduced-sodium tomato sauce and tomato paste. Low-sodium or reduced-sodium canned vegetables. Grains Whole-grain or whole-wheat bread. Whole-grain or whole-wheat pasta. Brown rice. Oatmeal. Quinoa. Bulgur. Whole-grain and low-sodium cereals. Pita bread. Low-fat, low-sodium crackers. Whole-wheat flour tortillas. Meats and other proteins Skinless chicken or turkey. Ground chicken or turkey. Pork with fat trimmed off. Fish and seafood. Egg whites. Dried beans, peas, or lentils. Unsalted nuts, nut butters, and seeds. Unsalted canned beans. Lean cuts of beef with fat trimmed off. Low-sodium, lean precooked or cured meat, such as sausages or meat loaves. Dairy Low-fat (1%) or fat-free (skim) milk. Reduced-fat, low-fat, or fat-free cheeses. Nonfat, low-sodium ricotta or cottage cheese. Low-fat or nonfat yogurt. Low-fat, low-sodium cheese. Fats and oils Soft margarine without trans fats. Vegetable oil. Reduced-fat, low-fat, or light mayonnaise and salad  dressings (reduced-sodium). Canola, safflower, olive, avocado, soybean, and sunflower oils. Avocado. Seasonings and condiments Herbs. Spices. Seasoning mixes without salt. Other foods Unsalted popcorn and pretzels. Fat-free sweets. The items listed above may not be all the foods and drinks you can have. Talk to a dietitian to learn more. What foods should I avoid? Fruits Canned fruit in a light or heavy syrup. Fried fruit. Fruit in cream or butter sauce. Vegetables Creamed or fried vegetables. Vegetables in a cheese sauce. Regular canned vegetables that are not marked as low-sodium or reduced-sodium. Regular canned tomato sauce and paste that are not marked as low-sodium or reduced-sodium. Regular tomato and vegetable juices that are not marked as low-sodium or reduced-sodium. Pickles. Olives. Grains Baked goods made with fat, such as croissants, muffins, or some breads. Dry pasta or rice meal packs. Meats and other proteins Fatty cuts of meat. Ribs. Fried meat. Bacon. Bologna, salami, and other precooked or cured meats, such as sausages or meat loaves, that are not lean and low in sodium. Fat from the back of a pig (fatback). Bratwurst. Salted nuts and seeds. Canned beans with added salt. Canned   or smoked fish. Whole eggs or egg yolks. Chicken or turkey with skin. Dairy Whole or 2% milk, cream, and half-and-half. Whole or full-fat cream cheese. Whole-fat or sweetened yogurt. Full-fat cheese. Nondairy creamers. Whipped toppings. Processed cheese and cheese spreads. Fats and oils Butter. Stick margarine. Lard. Shortening. Ghee. Bacon fat. Tropical oils, such as coconut, palm kernel, or palm oil. Seasonings and condiments Onion salt, garlic salt, seasoned salt, table salt, and sea salt. Worcestershire sauce. Tartar sauce. Barbecue sauce. Teriyaki sauce. Soy sauce, including reduced-sodium soy sauce. Steak sauce. Canned and packaged gravies. Fish sauce. Oyster sauce. Cocktail sauce. Store-bought  horseradish. Ketchup. Mustard. Meat flavorings and tenderizers. Bouillon cubes. Hot sauces. Pre-made or packaged marinades. Pre-made or packaged taco seasonings. Relishes. Regular salad dressings. Other foods Salted popcorn and pretzels. The items listed above may not be all the foods and drinks you should avoid. Talk to a dietitian to learn more. Where to find more information National Heart, Lung, and Blood Institute (NHLBI): nhlbi.nih.gov American Heart Association (AHA): heart.org Academy of Nutrition and Dietetics: eatright.org National Kidney Foundation (NKF): kidney.org This information is not intended to replace advice given to you by your health care provider. Make sure you discuss any questions you have with your health care provider. Document Revised: 09/29/2022 Document Reviewed: 09/29/2022 Elsevier Patient Education  2024 Elsevier Inc.  

## 2023-04-05 ENCOUNTER — Ambulatory Visit: Payer: Medicare Other | Admitting: Nurse Practitioner

## 2023-04-05 VITALS — BP 136/82 | HR 73 | Temp 98.4°F | Ht 67.52 in | Wt 262.8 lb

## 2023-04-05 DIAGNOSIS — D696 Thrombocytopenia, unspecified: Secondary | ICD-10-CM | POA: Diagnosis not present

## 2023-04-05 DIAGNOSIS — E782 Mixed hyperlipidemia: Secondary | ICD-10-CM

## 2023-04-05 DIAGNOSIS — L918 Other hypertrophic disorders of the skin: Secondary | ICD-10-CM

## 2023-04-05 DIAGNOSIS — R202 Paresthesia of skin: Secondary | ICD-10-CM | POA: Insufficient documentation

## 2023-04-05 DIAGNOSIS — E063 Autoimmune thyroiditis: Secondary | ICD-10-CM

## 2023-04-05 DIAGNOSIS — I10 Essential (primary) hypertension: Secondary | ICD-10-CM | POA: Diagnosis not present

## 2023-04-05 DIAGNOSIS — E559 Vitamin D deficiency, unspecified: Secondary | ICD-10-CM

## 2023-04-05 DIAGNOSIS — N1831 Chronic kidney disease, stage 3a: Secondary | ICD-10-CM | POA: Diagnosis not present

## 2023-04-05 DIAGNOSIS — R7309 Other abnormal glucose: Secondary | ICD-10-CM

## 2023-04-05 DIAGNOSIS — J4520 Mild intermittent asthma, uncomplicated: Secondary | ICD-10-CM

## 2023-04-05 DIAGNOSIS — M8588 Other specified disorders of bone density and structure, other site: Secondary | ICD-10-CM

## 2023-04-05 LAB — BAYER DCA HB A1C WAIVED: HB A1C (BAYER DCA - WAIVED): 6.3 % — ABNORMAL HIGH (ref 4.8–5.6)

## 2023-04-05 LAB — MICROALBUMIN, URINE WAIVED
Creatinine, Urine Waived: 200 mg/dL (ref 10–300)
Microalb, Ur Waived: 80 mg/L — ABNORMAL HIGH (ref 0–19)

## 2023-04-05 MED ORDER — OLMESARTAN MEDOXOMIL 5 MG PO TABS: 5.0000 mg | ORAL_TABLET | Freq: Every day | ORAL | 1 refills | Status: DC

## 2023-04-05 NOTE — Assessment & Plan Note (Signed)
Chronic, stable.  Adjust Levothyroxine dose as needed, at this time continue current dosing.  Thyroid labs today.

## 2023-04-05 NOTE — Assessment & Plan Note (Signed)
Chronic, ongoing.  Continue Simvastatin and adjust as needed.  Lipid panel today. 

## 2023-04-05 NOTE — Assessment & Plan Note (Signed)
Noted on exam to left side of neck and she reports hairdresser always nicking it.  Will plan on removal in 4 weeks in office, discussed with patient.

## 2023-04-05 NOTE — Progress Notes (Signed)
BP 136/82   Pulse 73   Temp 98.4 F (36.9 C) (Oral)   Ht 5' 7.52" (1.715 m)   Wt 262 lb 12.8 oz (119.2 kg)   LMP  (LMP Unknown)   SpO2 97%   BMI 40.53 kg/m    Subjective:    Patient ID: Wanda White, female    DOB: [birth year], 77 y.o.   MRN: [id number]  HPI: Wanda White is a 77 y.o. female  Chief Complaint  Patient presents with   Hypertension   Hyperlipidemia   Chronic Kidney Disease   osteopenia   Morbid Obesity   Hypothyroidism   Prediabetes   PREDIABETES A1c October 5.7% -- has been working on diet off and on. Tried Metformin, but this made her nauseous.  Has not been able to exercise as much due to heat + when wears running shoes or flip flops her feet sting to both sides. Takes ice pack puts her foot on it and this helps.  Taking Neurvive, has taken for one month, not noticing much difference.   Polydipsia/polyuria: no Visual disturbance: no Chest pain: no Paresthesias: no  HYPERTENSION / HYPERLIPIDEMIA Continues on ASA, Simvastatin, and fish oil.  No BP medications. Satisfied with current treatment? yes Duration of hypertension: chronic BP monitoring frequency: every morning BP range: 118-135/78 to 88 BP medication side effects: no Duration of hyperlipidemia: chronic Cholesterol medication side effects: no Cholesterol supplements: none Medication compliance: good compliance Aspirin: yes Recent stressors: no Recurrent headaches: no Visual changes: no Palpitations: no Dyspnea: only with the heat at present on occasion Chest pain: no Lower extremity edema: no Dizzy/lightheaded: no  The 10-year ASCVD risk score (Arnett DK, et al., 2019) is: 23.1%   Values used to calculate the score:     Age: 42 years     Sex: Female     Is Non-Hispanic African American: No     Diabetic: No     Tobacco smoker: No     Systolic Blood Pressure: 136 mmHg     Is BP treated: Yes     HDL Cholesterol: 49 mg/dL     Total Cholesterol: 167 mg/dL   CHRONIC KIDNEY  DISEASE Remaining stable with CKD 3a on past labs. CKD status: stable Medications renally dose: yes Previous renal evaluation: no Pneumovax:  Up to Date Influenza Vaccine:  Up to Date   HYPOTHYROIDISM Continues on Levothyroxine 50 MCG. Thyroid control status:stable Satisfied with current treatment? yes Medication side effects: no Medication compliance: good compliance Etiology of hypothyroidism: Hashimoto's Recent dose adjustment:no Fatigue: no Cold intolerance: no Heat intolerance: no Weight gain: no Weight loss: no Constipation: no Diarrhea/loose stools: no Palpitations: no Lower extremity edema: no Anxiety/depressed mood: no   OSTEOPENIA Last DEXA on 06/17/22 with T-score -2.1. Satisfied with current treatment?: yes Adequate calcium & vitamin D: yes Weight bearing exercises: yes   ASTHMA No current inhalers. Asthma status: stable Satisfied with current treatment?: yes Albuterol/rescue inhaler frequency: does not use one Dyspnea frequency: occasional with heat Wheezing frequency: no Cough frequency: no Nocturnal symptom frequency: no Limitation of activity: no Current upper respiratory symptoms: no Aerochamber/spacer use: no Visits to ER or Urgent Care in past year: no Pneumovax: Up to Date Influenza: Up to Date   Relevant past medical, surgical, family and social history reviewed and updated as indicated. Interim medical history since our last visit reviewed. Allergies and medications reviewed and updated.  Review of Systems  Constitutional:  Negative for activity change, appetite change, diaphoresis, fatigue and  fever.  Respiratory:  Negative for cough, chest tightness, shortness of breath and wheezing.   Cardiovascular:  Negative for chest pain, palpitations and leg swelling.  Gastrointestinal: Negative.   Endocrine: Negative for cold intolerance and heat intolerance.  Neurological:  Positive for numbness. Negative for dizziness, tremors, weakness and  headaches.  Psychiatric/Behavioral: Negative.     Per HPI unless specifically indicated above     Objective:    BP 136/82   Pulse 73   Temp 98.4 F (36.9 C) (Oral)   Ht 5' 7.52" (1.715 m)   Wt 262 lb 12.8 oz (119.2 kg)   LMP  (LMP Unknown)   SpO2 97%   BMI 40.53 kg/m   Wt Readings from Last 3 Encounters:  04/05/23 262 lb 12.8 oz (119.2 kg)  07/15/22 259 lb 9.6 oz (117.8 kg)  05/25/22 256 lb 11.2 oz (116.4 kg)    Physical Exam Vitals and nursing note reviewed.  Constitutional:      General: She is awake. She is not in acute distress.    Appearance: She is well-developed. She is obese. She is not ill-appearing.  HENT:     Head: Normocephalic and atraumatic.     Right Ear: Hearing normal.     Left Ear: Hearing normal.     Mouth/Throat:     Mouth: Mucous membranes are moist.  Eyes:     General: Lids are normal.        Right eye: No discharge.        Left eye: No discharge.     Conjunctiva/sclera: Conjunctivae normal.     Pupils: Pupils are equal, round, and reactive to light.  Neck:     Thyroid: No thyromegaly or thyroid tenderness.     Vascular: No carotid bruit.  Cardiovascular:     Rate and Rhythm: Normal rate and regular rhythm.     Pulses:          Dorsalis pedis pulses are 2+ on the right side and 2+ on the left side.       Posterior tibial pulses are 2+ on the right side and 2+ on the left side.     Heart sounds: Normal heart sounds. No murmur heard.    No gallop.  Pulmonary:     Effort: Pulmonary effort is normal. No accessory muscle usage or respiratory distress.     Breath sounds: Normal breath sounds.  Abdominal:     General: Bowel sounds are normal.     Palpations: Abdomen is soft.     Tenderness: There is no abdominal tenderness.  Musculoskeletal:     Cervical back: Normal range of motion and neck supple.     Right lower leg: No edema.     Left lower leg: No edema.     Right foot: Normal range of motion.     Left foot: Normal range of motion.   Feet:     Right foot:     Protective Sensation: 10 sites tested.  10 sites sensed.     Skin integrity: Callus (to great toe) present.     Toenail Condition: Right toenails are abnormally thick.     Left foot:     Protective Sensation: 10 sites tested.  10 sites sensed.     Skin integrity: Callus (to great toe) present.     Toenail Condition: Left toenails are abnormally thick.  Skin:    General: Skin is warm and dry.     Findings: Lesion present.  Neurological:     General: No focal deficit present.     Mental Status: She is alert and oriented to person, place, and time.     Deep Tendon Reflexes: Reflexes are normal and symmetric.  Psychiatric:        Attention and Perception: Attention normal.        Mood and Affect: Mood normal.        Speech: Speech normal.        Behavior: Behavior normal. Behavior is cooperative.        Thought Content: Thought content normal.        Judgment: Judgment normal.    Results for orders placed or performed in visit on 07/15/22  Bayer DCA Hb A1c Waived  Result Value Ref Range   HB A1C (BAYER DCA - WAIVED) 5.7 (H) 4.8 - 5.6 %  Basic metabolic panel  Result Value Ref Range   Glucose 113 (H) 70 - 99 mg/dL   BUN 12 8 - 27 mg/dL   Creatinine, Ser 1.61 (H) 0.57 - 1.00 mg/dL   eGFR 55 (L) >09 UE/AVW/0.98   BUN/Creatinine Ratio 11 (L) 12 - 28   Sodium 137 134 - 144 mmol/L   Potassium 4.0 3.5 - 5.2 mmol/L   Chloride 100 96 - 106 mmol/L   CO2 22 20 - 29 mmol/L   Calcium 9.8 8.7 - 10.3 mg/dL  CBC with Differential/Platelet  Result Value Ref Range   WBC 4.3 3.4 - 10.8 x10E3/uL   RBC 4.47 3.77 - 5.28 x10E6/uL   Hemoglobin 13.4 11.1 - 15.9 g/dL   Hematocrit 11.9 14.7 - 46.6 %   MCV 90 79 - 97 fL   MCH 30.0 26.6 - 33.0 pg   MCHC 33.3 31.5 - 35.7 g/dL   RDW 82.9 56.2 - 13.0 %   Platelets 141 (L) 150 - 450 x10E3/uL   Neutrophils 36 Not Estab. %   Lymphs 43 Not Estab. %   Monocytes 19 Not Estab. %   Eos 0 Not Estab. %   Basos 1 Not  Estab. %   Neutrophils Absolute 1.6 1.4 - 7.0 x10E3/uL   Lymphocytes Absolute 1.9 0.7 - 3.1 x10E3/uL   Monocytes Absolute 0.8 0.1 - 0.9 x10E3/uL   EOS (ABSOLUTE) 0.0 0.0 - 0.4 x10E3/uL   Basophils Absolute 0.0 0.0 - 0.2 x10E3/uL   Immature Granulocytes 1 Not Estab. %   Immature Grans (Abs) 0.0 0.0 - 0.1 x10E3/uL      Assessment & Plan:   Problem List Items Addressed This Visit       Cardiovascular and Mediastinum   Hypertension    Chronic, stable. BP stable in office and at home, but continues to have CKD 3a and proteinuria.  Have recommended starting ARB at low dose for kidney protection and may offer benefit to BP as well.  We discussed this at length and joint decision to trial this.  Olmesartan 5 MG sent in, educated her on this medication use and side effects.  Recommend she monitor BP at least a few mornings a week at home and document.  DASH diet at home.  Continue current medication regimen and adjust as needed.  Labs today: CBC, CMP, TSH, urine ALB.  Urine ALB 80 July 2024.  Return in 4 weeks.       Relevant Medications   olmesartan (BENICAR) 5 MG tablet   Other Relevant Orders   CBC with Differential/Platelet   Comprehensive metabolic panel     Respiratory  Asthma    Chronic, stable without inhalers.  Continue to monitor.      Relevant Orders   CBC with Differential/Platelet     Endocrine   Hashimoto's thyroiditis    Chronic, stable.  Adjust Levothyroxine dose as needed, at this time continue current dosing.  Thyroid labs today.      Relevant Orders   TSH   T4, free     Musculoskeletal and Integument   Inflamed skin tag    Noted on exam to left side of neck and she reports hairdresser always nicking it.  Will plan on removal in 4 weeks in office, discussed with patient.      Osteopenia of lumbar spine    Noted on DEXA in 2016 and 2023. Continue supplements at home.  Discussed with patient. Repeat DEXA in 2028.  Vitamin D level today.      Relevant  Orders   VITAMIN D 25 Hydroxy (Vit-D Deficiency, Fractures)     Genitourinary   CKD (chronic kidney disease) stage 3, GFR 30-59 ml/min (HCC) - Primary    Chronic, stable CKD 3a. Continue to monitor on labs. Urine ALB 80 July 2024.  We discussed starting a low dose ARB for kidney protection and may also offer benefit to BP. Joint decision made to trial this.  Olmesartan 5 MG sent in, educated her on this and side effects.  Consider nephrology referral in future if any worsening function. CMP today. Return in 4 weeks.      Relevant Orders   Microalbumin, Urine Waived   Comprehensive metabolic panel     Hematopoietic and Hemostatic   Thrombocytopenia (HCC)    Noted on previous labs, she does take low dose daily ASA. Recheck CBC today. Recommend she continue ASA three days a week only and may need to stop in future.      Relevant Orders   CBC with Differential/Platelet     Other   Elevated hemoglobin A1c measurement    A1c trend up today to 6.3%, remains in prediabetic range but trending up due to dietary indiscretions.  Recommend continue diet and activity focus at home.  We will initiate medication if needed in future.  Did not tolerate Metformin in past.      Relevant Orders   Bayer DCA Hb A1c Waived   Microalbumin, Urine Waived   Hyperlipidemia    Chronic, ongoing. Continue Simvastatin and adjust as needed. Lipid panel today..      Relevant Medications   olmesartan (BENICAR) 5 MG tablet   Other Relevant Orders   Comprehensive metabolic panel   Lipid Panel w/o Chol/HDL Ratio   Morbid obesity (HCC)    BMI 40.53. Recommended eating smaller high protein, low fat meals more frequently and exercising 30 mins a day 5 times a week with a goal of 10-15lb weight loss in the next 3 months. Patient voiced their understanding and motivation to adhere to these recommendations.        Pins and needles sensation    Present to both feet, suspect some neuropathy presenting.  ?related to  prediabetes.  Overall foot exam stable.  At this time continue Neurvive and start Alpha Lipoic Acid at night.  Monitor closely for wounds to feet.  If worsening or ongoing then consider neurology referral and possibly trial some Gabapentin.      Vitamin D deficiency    Continues on daily supplement, check level today and adjust regimen as needed.      Relevant Orders  VITAMIN D 25 Hydroxy (Vit-D Deficiency, Fractures)     Follow up plan: Return in about 4 weeks (around 05/03/2023) for CKD and SKIN TAG REMOVAL LEFT NECK.

## 2023-04-05 NOTE — Assessment & Plan Note (Signed)
Chronic, stable. BP stable in office and at home, but continues to have CKD 3a and proteinuria.  Have recommended starting ARB at low dose for kidney protection and may offer benefit to BP as well.  We discussed this at length and joint decision to trial this.  Olmesartan 5 MG sent in, educated her on this medication use and side effects.  Recommend she monitor BP at least a few mornings a week at home and document.  DASH diet at home.  Continue current medication regimen and adjust as needed.  Labs today: CBC, CMP, TSH, urine ALB.  Urine ALB 80 July 2024.  Return in 4 weeks.

## 2023-04-05 NOTE — Assessment & Plan Note (Signed)
Continues on daily supplement, check level today and adjust regimen as needed. 

## 2023-04-05 NOTE — Assessment & Plan Note (Signed)
Noted on previous labs, she does take low dose daily ASA. Recheck CBC today. Recommend she continue ASA three days a week only and may need to stop in future.

## 2023-04-05 NOTE — Assessment & Plan Note (Signed)
BMI 40.53.  Recommended eating smaller high protein, low fat meals more frequently and exercising 30 mins a day 5 times a week with a goal of 10-15lb weight loss in the next 3 months. Patient voiced their understanding and motivation to adhere to these recommendations.  

## 2023-04-05 NOTE — Assessment & Plan Note (Signed)
Chronic, stable CKD 3a. Continue to monitor on labs. Urine ALB 80 July 2024.  We discussed starting a low dose ARB for kidney protection and may also offer benefit to BP. Joint decision made to trial this.  Olmesartan 5 MG sent in, educated her on this and side effects.  Consider nephrology referral in future if any worsening function. CMP today. Return in 4 weeks.

## 2023-04-05 NOTE — Assessment & Plan Note (Signed)
Noted on DEXA in 2016 and 2023. Continue supplements at home.  Discussed with patient. Repeat DEXA in 2028.  Vitamin D level today.

## 2023-04-05 NOTE — Assessment & Plan Note (Signed)
Present to both feet, suspect some neuropathy presenting.  ?related to prediabetes.  Overall foot exam stable.  At this time continue Neurvive and start Alpha Lipoic Acid at night.  Monitor closely for wounds to feet.  If worsening or ongoing then consider neurology referral and possibly trial some Gabapentin.

## 2023-04-05 NOTE — Assessment & Plan Note (Signed)
Chronic, stable without inhalers.  Continue to monitor.

## 2023-04-05 NOTE — Assessment & Plan Note (Signed)
A1c trend up today to 6.3%, remains in prediabetic range but trending up due to dietary indiscretions.  Recommend continue diet and activity focus at home.  We will initiate medication if needed in future.  Did not tolerate Metformin in past.

## 2023-04-06 LAB — CBC WITH DIFFERENTIAL/PLATELET
Basophils Absolute: 0 10*3/uL (ref 0.0–0.2)
Basos: 1 %
EOS (ABSOLUTE): 0 10*3/uL (ref 0.0–0.4)
Eos: 1 %
Hematocrit: 40.4 % (ref 34.0–46.6)
Hemoglobin: 13.4 g/dL (ref 11.1–15.9)
Immature Grans (Abs): 0 10*3/uL (ref 0.0–0.1)
Immature Granulocytes: 1 %
Lymphocytes Absolute: 1.6 10*3/uL (ref 0.7–3.1)
Lymphs: 38 %
MCH: 30.9 pg (ref 26.6–33.0)
MCHC: 33.2 g/dL (ref 31.5–35.7)
MCV: 93 fL (ref 79–97)
Monocytes Absolute: 0.9 10*3/uL (ref 0.1–0.9)
Monocytes: 21 %
Neutrophils Absolute: 1.6 10*3/uL (ref 1.4–7.0)
Neutrophils: 38 %
Platelets: 136 10*3/uL — ABNORMAL LOW (ref 150–450)
RBC: 4.33 x10E6/uL (ref 3.77–5.28)
RDW: 12.2 % (ref 11.7–15.4)
WBC: 4.1 10*3/uL (ref 3.4–10.8)

## 2023-04-06 LAB — COMPREHENSIVE METABOLIC PANEL
ALT: 20 IU/L (ref 0–32)
AST: 19 IU/L (ref 0–40)
Albumin: 4.4 g/dL (ref 3.8–4.8)
Alkaline Phosphatase: 67 IU/L (ref 44–121)
BUN/Creatinine Ratio: 13 (ref 12–28)
BUN: 15 mg/dL (ref 8–27)
Bilirubin Total: 0.4 mg/dL (ref 0.0–1.2)
CO2: 23 mmol/L (ref 20–29)
Calcium: 9.3 mg/dL (ref 8.7–10.3)
Chloride: 103 mmol/L (ref 96–106)
Creatinine, Ser: 1.16 mg/dL — ABNORMAL HIGH (ref 0.57–1.00)
Globulin, Total: 2.6 g/dL (ref 1.5–4.5)
Glucose: 113 mg/dL — ABNORMAL HIGH (ref 70–99)
Potassium: 4 mmol/L (ref 3.5–5.2)
Sodium: 140 mmol/L (ref 134–144)
Total Protein: 7 g/dL (ref 6.0–8.5)
eGFR: 49 mL/min/{1.73_m2} — ABNORMAL LOW (ref >59)

## 2023-04-06 LAB — LIPID PANEL W/O CHOL/HDL RATIO
Cholesterol, Total: 172 mg/dL (ref 100–199)
HDL: 46 mg/dL (ref >39)
LDL Chol Calc (NIH): 97 mg/dL (ref 0–99)
Triglycerides: 166 mg/dL — ABNORMAL HIGH (ref 0–149)
VLDL Cholesterol Cal: 29 mg/dL (ref 5–40)

## 2023-04-06 LAB — T4, FREE: Free T4: 1.14 ng/dL (ref 0.82–1.77)

## 2023-04-06 LAB — VITAMIN D 25 HYDROXY (VIT D DEFICIENCY, FRACTURES): Vit D, 25-Hydroxy: 50.8 ng/mL (ref 30.0–100.0)

## 2023-04-06 LAB — TSH: TSH: 2.59 u[IU]/mL (ref 0.450–4.500)

## 2023-04-06 NOTE — Progress Notes (Signed)
Contacted via MyChart   Good afternoon Wanda White, your labs have returned: - CBC shows no anemia or infection. Platelets remain a little low, this has been ongoing for 2 years and is stable.  I recommend taking no aspirin at home. - Kidney function, creatinine and eGFR, continues to show Stage 3a Kidney disease -- we will continue to monitor closely and start the Olmesartan like we discussed.  Ensure plenty of water intake daily and no Ibuprofen products.  Liver function, AST and ALT, are normal. - Cholesterol levels are stable, continue Simvastatin. - Thyroid labs stable -- no Levothyroxine changes. - Vitamin D level normal.  Continue all medications.  Any questions? Keep being amazing!!  Thank you for allowing me to participate in your care.  I appreciate you. Kindest regards, Keiana Tavella

## 2023-04-08 ENCOUNTER — Encounter: Payer: Self-pay | Admitting: Nurse Practitioner

## 2023-04-27 NOTE — Patient Instructions (Signed)
Be Involved in Caring For Your Health:  Taking Medications When medications are taken as directed, they can greatly improve your health. But if they are not taken as prescribed, they may not work. In some cases, not taking them correctly can be harmful. To help ensure your treatment remains effective and safe, understand your medications and how to take them. Bring your medications to each visit for review by your provider.  Your lab results, notes, and after visit summary will be available on My Chart. We strongly encourage you to use this feature. If lab results are abnormal the clinic will contact you with the appropriate steps. If the clinic does not contact you assume the results are satisfactory. You can always view your results on My Chart. If you have questions regarding your health or results, please contact the clinic during office hours. You can also ask questions on My Chart.  We at [facility] are grateful that you chose [place] to provide your care. We strive to provide evidence-based and compassionate care and are always looking for feedback. If you get a survey from the clinic please complete this so we can hear your opinions.  Food Basics for Chronic Kidney Disease Chronic kidney disease (CKD) is when your kidneys are not working well. They cannot remove waste, fluids, and other substances from your blood the way they should. These substances can build up, which can worsen kidney damage and affect how your body works. Eating certain foods can lead to a buildup of these substances. Changing your diet can help prevent more kidney damage. Diet changes may also delay dialysis or even keep you from needing it. What nutrients should I limit? Work with your treatment team and a food expert (dietitian) to make a meal plan that's right for you. Foods you can eat and foods you should limit or avoid will depend on the stage of your kidney disease and any other health conditions you have.  The items listed below are not a complete list. Talk with your dietitian to learn what is best for you. Potassium Potassium affects how steadily your heart beats. Too much potassium in your blood can cause an irregular heartbeat or even a heart attack. You may need to limit foods that are high in potassium, such as: Liquid milk and soy milk. Salt substitutes that contain potassium. Fruits like bananas, apricots, nectarines, melon, prunes, raisins, kiwi, and oranges. Vegetables, such as potatoes, sweet potatoes, yams, tomatoes, leafy greens, beets, avocado, pumpkin, and winter squash. Beans, like lima beans. Nuts. Phosphorus Phosphorus is a mineral found in your bones. You need a balance between calcium and phosphorus to build and maintain healthy bones. Too much added phosphorus from the foods you eat can pull calcium from your bones. Losing calcium can make your bones weak and more likely to break. Too much phosphorus can also make your skin itch. You may need to limit foods that are high in phosphorus or that have added phosphorus, such as: Liquid milk and dairy products. Dark-colored sodas or soft drinks. Bran cereals and oatmeal. Protein  Protein helps you make and keep muscle. Protein also helps to repair your body's cells and tissues. One of the natural breakdown products of protein is a waste product called urea. When your kidneys are not working well, they cannot remove types of waste like urea. Reducing protein in your diet can help keep urea from building up in your blood. Depending on your stage of kidney disease, you may need to  eat smaller portions of foods that are high in protein. Sources of animal protein include: Meat (all types). Fish and seafood. Poultry. Eggs. Dairy. Other protein foods include: Beans and legumes. Nuts and nut butter. Soy, like tofu.  Sodium Salt (sodium) helps to keep a healthy balance of fluids in your body. Too much salt can increase your blood  pressure, which can harm your heart and lungs. Extra salt can also cause your body to keep too much fluid, making your kidneys work harder. You may need to limit or avoid foods that are high in salt, such as: Salt seasonings. Soy and teriyaki sauce. Packaged, precooked, cured, or processed meats, such as sausages or meat loaves. Sardines. Salted crackers and snack foods. Fast food. Canned soups and most canned foods. Pickled foods. Vegetable juice. Boxed mixes or ready-to-eat boxed meals and side dishes. Bottled dressings, sauces, and marinades. Talk with your dietitian about how much potassium, phosphorus, protein, and salt you may have each day. Helpful tips Read food labels  Check the amount of salt in foods. Limit foods that have salt or sodium listed among the first five ingredients. Try to eat low-salt foods. Check the ingredient list for added phosphorus or potassium. "Phos" in an ingredient is a sign that phosphorus has been added. Do not buy foods that are calcium-enriched or that have calcium added to them (are fortified). Buy canned vegetables and beans that say "no salt added" and rinse them before eating. Lifestyle Limit the amount of protein you eat from animal sources each day. Focus on protein from plant sources, like tofu and dried beans, peas, and lentils. Do not add salt to food when cooking or before eating. Do not eat star fruit. It can be toxic for people with kidney problems. Talk with your health care provider before taking any vitamin or mineral supplements. If told by your health care provider, track how much liquid you drink so you can avoid drinking too much. You may need to include foods you eat that are made mostly from water, like gelatin, ice cream, soups, and juicy fruits and vegetables. If you have diabetes: If you have diabetes (diabetes mellitus) and CKD, you need to keep your blood sugar (glucose) in the target range recommended by your health care  provider. Follow your diabetes management plan. This may include: Checking your blood glucose regularly. Taking medicines by mouth, or taking insulin, or both. Exercising for at least 30 minutes on 5 or more days each week, or as told by your health care provider. Tracking how many servings of carbohydrates you eat at each meal. Not using orange juice to treat low blood sugars. Instead, use apple juice, cranberry juice, or clear soda. You may be given guidelines on what foods and nutrients you may eat, and how much you can have each day. This depends on your stage of kidney disease and whether you have high blood pressure (hypertension). Follow the meal plan your dietitian gives you. To learn more: [employer] of Diabetes and Digestive and Kidney Diseases: [employer]: kidney.org Summary Chronic kidney disease (CKD) is when your kidneys are not working well. They cannot remove waste, fluids, and other substances from your blood the way they should. These substances can build up, which can worsen kidney damage and affect how your body works. Changing your diet can help prevent more kidney damage. Diet changes may also delay dialysis or even keep you from needing it. Diet changes are different for each person with CKD.  Work with a dietitian to set up a meal plan that is right for you. This information is not intended to replace advice given to you by your health care provider. Make sure you discuss any questions you have with your health care provider. Document Revised: 12/30/2021 Document Reviewed: 01/06/2020 Elsevier Patient Education  2024 [employer].

## 2023-05-05 ENCOUNTER — Other Ambulatory Visit (HOSPITAL_COMMUNITY)
Admission: RE | Admit: 2023-05-05 | Discharge: 2023-05-05 | Disposition: A | Discharge status: Home / Self Care | Payer: Medicare Other | Source: Ambulatory Visit | Attending: Nurse Practitioner | Admitting: Nurse Practitioner

## 2023-05-05 ENCOUNTER — Ambulatory Visit: Payer: Medicare Other | Admitting: Nurse Practitioner

## 2023-05-05 ENCOUNTER — Encounter: Payer: Self-pay | Admitting: Nurse Practitioner

## 2023-05-05 VITALS — BP 128/76 | HR 80 | Temp 98.1°F | Ht 67.5 in | Wt 262.8 lb

## 2023-05-05 DIAGNOSIS — I10 Essential (primary) hypertension: Secondary | ICD-10-CM

## 2023-05-05 DIAGNOSIS — L918 Other hypertrophic disorders of the skin: Secondary | ICD-10-CM | POA: Diagnosis not present

## 2023-05-05 DIAGNOSIS — N1831 Chronic kidney disease, stage 3a: Secondary | ICD-10-CM | POA: Diagnosis not present

## 2023-05-05 NOTE — Assessment & Plan Note (Signed)
Two inflamed skin tags to anterior and left side neck.  Removed in office today and sent to pathology. Patient tolerated procedure well.

## 2023-05-05 NOTE — Assessment & Plan Note (Signed)
Chronic, stable CKD 3a. Continue to monitor on labs. Urine ALB 80 July 2024.  Continue Olmesartan 5 MG daily, educated her on this and side effects.  Consider nephrology referral in future if any worsening function. BMP today.

## 2023-05-05 NOTE — Assessment & Plan Note (Signed)
BMI 40.55 with HTN and CKD.  Recommended eating smaller high protein, low fat meals more frequently and exercising 30 mins a day 5 times a week with a goal of 10-15lb weight loss in the next 3 months. Patient voiced their understanding and motivation to adhere to these recommendations.

## 2023-05-05 NOTE — Progress Notes (Signed)
BP 128/76   Pulse 80   Temp 98.1 F (36.7 C) (Oral)   Ht 5' 7.5" (1.715 m)   Wt 262 lb 12.8 oz (119.2 kg)   LMP  (LMP Unknown)   SpO2 97%   BMI 40.55 kg/m    Subjective:    Patient ID: Wanda White, female    DOB: 1945/11/06, 77 y.o.   MRN: [id number]  HPI: Wanda White is a 77 y.o. female  Chief Complaint  Patient presents with   Chronic Kidney Disease   Skin Tag Removal   CHRONIC KIDNEY DISEASE Stage 3a Started Olmesartan last visit for proteinuria and HTN.  Occasionally feels a little fatigue, but no other ADR. CKD status: stable Medications renally dose: yes Previous renal evaluation: no Pneumovax:  Up to Date Influenza Vaccine:  Up to Date   HYPERTENSION with Chronic Kidney Disease Stage 3a Started Olmesartan last visit for proteinuria and HTN.  BP improved at home with medication on board. Hypertension status: stable  Satisfied with current treatment? yes Duration of hypertension: chronic BP monitoring frequency:  daily BP range: <130/80 consistently BP medication side effects:  no Medication compliance: good compliance Aspirin: no Recurrent headaches: no Visual changes: no Palpitations: no Dyspnea: no Chest pain: no Lower extremity edema: no Dizzy/lightheaded: no   SKIN LESION Has two skin tags on neck that are causing irritation to her and getting red in color. Duration: months Location: to anterior neck and left side neck Painful: yes Itching: yes Onset: gradual Context: changing Associated signs and symptoms:  History of skin cancer: no History of precancerous skin lesions: no Family history of skin cancer: mom had basal cell  Relevant past medical, surgical, family and social history reviewed and updated as indicated. Interim medical history since our last visit reviewed. Allergies and medications reviewed and updated.  Review of Systems  Constitutional:  Negative for activity change, appetite change, diaphoresis, fatigue and fever.   Respiratory:  Negative for cough, chest tightness, shortness of breath and wheezing.   Cardiovascular:  Negative for chest pain, palpitations and leg swelling.  Gastrointestinal: Negative.   Endocrine: Negative for cold intolerance and heat intolerance.  Skin:        Skin tags  Neurological: Negative.   Psychiatric/Behavioral: Negative.     Per HPI unless specifically indicated above     Objective:    BP 128/76   Pulse 80   Temp 98.1 F (36.7 C) (Oral)   Ht 5' 7.5" (1.715 m)   Wt 262 lb 12.8 oz (119.2 kg)   LMP  (LMP Unknown)   SpO2 97%   BMI 40.55 kg/m   Wt Readings from Last 3 Encounters:  05/05/23 262 lb 12.8 oz (119.2 kg)  04/05/23 262 lb 12.8 oz (119.2 kg)  07/15/22 259 lb 9.6 oz (117.8 kg)    Physical Exam Vitals and nursing note reviewed.  Constitutional:      General: She is awake. She is not in acute distress.    Appearance: She is well-developed. She is obese. She is not ill-appearing.  HENT:     Head: Normocephalic and atraumatic.     Right Ear: Hearing normal.     Left Ear: Hearing normal.     Mouth/Throat:     Mouth: Mucous membranes are moist.  Eyes:     General: Lids are normal.        Right eye: No discharge.        Left eye: No discharge.  Conjunctiva/sclera: Conjunctivae normal.     Pupils: Pupils are equal, round, and reactive to light.  Neck:     Thyroid: No thyromegaly or thyroid tenderness.     Vascular: No carotid bruit.  Cardiovascular:     Rate and Rhythm: Normal rate and regular rhythm.     Heart sounds: Normal heart sounds. No murmur heard.    No gallop.  Pulmonary:     Effort: Pulmonary effort is normal. No accessory muscle usage or respiratory distress.     Breath sounds: Normal breath sounds.  Abdominal:     General: Bowel sounds are normal.     Palpations: Abdomen is soft.     Tenderness: There is no abdominal tenderness.  Musculoskeletal:     Cervical back: Normal range of motion and neck supple.     Right lower leg:  No edema.     Left lower leg: No edema.  Skin:    General: Skin is warm and dry.     Findings: Lesion present.       Neurological:     General: No focal deficit present.     Mental Status: She is alert and oriented to person, place, and time.     Deep Tendon Reflexes: Reflexes are normal and symmetric.  Psychiatric:        Attention and Perception: Attention normal.        Mood and Affect: Mood normal.        Speech: Speech normal.        Behavior: Behavior normal. Behavior is cooperative.        Thought Content: Thought content normal.        Judgment: Judgment normal.   Procedure: Skin tag removal left side and anterior neck Informed consent:  Discussed risks (permanent scarring, infection, pain, bleeding, bruising, redness, and recurrence of the lesion) and benefits of the procedure, as well as the alternatives.  She is aware that skin tags are benign lesions, and their removal is at times not considered medically necessary.  Informed consent was obtained. Anesthesia: Marcaine 1% 3 cc The areas were prepared and draped in a standard fashion. Snip removal was performed.   Antibiotic ointment and a sterile dressing were applied.   The patient tolerated procedure well. The patient was instructed on post-op care.   Number of lesions removed:  2   BMI Metric Follow Up - 05/05/23 1924       BMI Metric Follow Up-Please document annually   BMI Metric Follow Up Nutrition counseling             Results for orders placed or performed in visit on 04/05/23  Bayer DCA Hb A1c Waived  Result Value Ref Range   HB A1C (BAYER DCA - WAIVED) 6.3 (H) 4.8 - 5.6 %  Microalbumin, Urine Waived  Result Value Ref Range   Microalb, Ur Waived 80 (H) 0 - 19 mg/L   Creatinine, Urine Waived 200 10 - 300 mg/dL   Microalb/Creat Ratio 30-300 (H) <30 mg/g  CBC with Differential/Platelet  Result Value Ref Range   WBC 4.1 3.4 - 10.8 x10E3/uL   RBC 4.33 3.77 - 5.28 x10E6/uL   Hemoglobin 13.4 11.1 -  15.9 g/dL   Hematocrit 16.1 09.6 - 46.6 %   MCV 93 79 - 97 fL   MCH 30.9 26.6 - 33.0 pg   MCHC 33.2 31.5 - 35.7 g/dL   RDW 04.5 40.9 - 81.1 %   Platelets 136 (L) 150 - 450  x10E3/uL   Neutrophils 38 Not Estab. %   Lymphs 38 Not Estab. %   Monocytes 21 Not Estab. %   Eos 1 Not Estab. %   Basos 1 Not Estab. %   Neutrophils Absolute 1.6 1.4 - 7.0 x10E3/uL   Lymphocytes Absolute 1.6 0.7 - 3.1 x10E3/uL   Monocytes Absolute 0.9 0.1 - 0.9 x10E3/uL   EOS (ABSOLUTE) 0.0 0.0 - 0.4 x10E3/uL   Basophils Absolute 0.0 0.0 - 0.2 x10E3/uL   Immature Granulocytes 1 Not Estab. %   Immature Grans (Abs) 0.0 0.0 - 0.1 x10E3/uL   Hematology Comments: Note:   Comprehensive metabolic panel  Result Value Ref Range   Glucose 113 (H) 70 - 99 mg/dL   BUN 15 8 - 27 mg/dL   Creatinine, Ser 8.65 (H) 0.57 - 1.00 mg/dL   eGFR 49 (L) >78 IO/NGE/9.52   BUN/Creatinine Ratio 13 12 - 28   Sodium 140 134 - 144 mmol/L   Potassium 4.0 3.5 - 5.2 mmol/L   Chloride 103 96 - 106 mmol/L   CO2 23 20 - 29 mmol/L   Calcium 9.3 8.7 - 10.3 mg/dL   Total Protein 7.0 6.0 - 8.5 g/dL   Albumin 4.4 3.8 - 4.8 g/dL   Globulin, Total 2.6 1.5 - 4.5 g/dL   Bilirubin Total 0.4 0.0 - 1.2 mg/dL   Alkaline Phosphatase 67 44 - 121 IU/L   AST 19 0 - 40 IU/L   ALT 20 0 - 32 IU/L  Lipid Panel w/o Chol/HDL Ratio  Result Value Ref Range   Cholesterol, Total 172 100 - 199 mg/dL   Triglycerides 841 (H) 0 - 149 mg/dL   HDL 46 >32 mg/dL   VLDL Cholesterol Cal 29 5 - 40 mg/dL   LDL Chol Calc (NIH) 97 0 - 99 mg/dL  TSH  Result Value Ref Range   TSH 2.590 0.450 - 4.500 uIU/mL  T4, free  Result Value Ref Range   Free T4 1.14 0.82 - 1.77 ng/dL  VITAMIN D 25 Hydroxy (Vit-D Deficiency, Fractures)  Result Value Ref Range   Vit D, 25-Hydroxy 50.8 30.0 - 100.0 ng/mL      Assessment & Plan:   Problem List Items Addressed This Visit       Cardiovascular and Mediastinum   Hypertension    Chronic, stable. BP stable in office and at home,  with CKD 3a and proteinuria.  Continue Olmesartan 5 MG as is tolerating and is offering benefit to BP.  Recommend she monitor BP at least a few mornings a week at home and document.  DASH diet at home.  Continue current medication regimen and adjust as needed.  Labs today: BMP.  Urine ALB 80 July 2024.         Musculoskeletal and Integument   Inflamed skin tag    Two inflamed skin tags to anterior and left side neck.  Removed in office today and sent to pathology. Patient tolerated procedure well.      Relevant Orders   Surgical pathology     Genitourinary   CKD (chronic kidney disease) stage 3, GFR 30-59 ml/min (HCC) - Primary    Chronic, stable CKD 3a. Continue to monitor on labs. Urine ALB 80 July 2024.  Continue Olmesartan 5 MG daily, educated her on this and side effects.  Consider nephrology referral in future if any worsening function. BMP today.      Relevant Orders   Basic metabolic panel     Other  Morbid obesity (HCC)    BMI 40.55 with HTN and CKD.  Recommended eating smaller high protein, low fat meals more frequently and exercising 30 mins a day 5 times a week with a goal of 10-15lb weight loss in the next 3 months. Patient voiced their understanding and motivation to adhere to these recommendations.         Follow up plan: Return in about 18 weeks (around 09/08/2023) for Annual Physical.

## 2023-05-05 NOTE — Assessment & Plan Note (Signed)
Chronic, stable. BP stable in office and at home, with CKD 3a and proteinuria.  Continue Olmesartan 5 MG as is tolerating and is offering benefit to BP.  Recommend she monitor BP at least a few mornings a week at home and document.  DASH diet at home.  Continue current medication regimen and adjust as needed.  Labs today: BMP.  Urine ALB 80 July 2024.

## 2023-05-06 NOTE — Progress Notes (Signed)
Contacted via MyChart   Good morning Wanda White, your labs have returned and kidney function remains stable in kidney disease stage 3a, continue Olmesartan daily.  Electrolytes are normal.  Any questions? Keep being wonderful!!  Thank you for allowing me to participate in your care.  I appreciate you. Kindest regards, Tenzin Edelman

## 2023-05-09 ENCOUNTER — Other Ambulatory Visit: Payer: Self-pay | Admitting: Nurse Practitioner

## 2023-05-10 ENCOUNTER — Other Ambulatory Visit: Payer: Self-pay | Admitting: Nurse Practitioner

## 2023-05-10 NOTE — Telephone Encounter (Signed)
Medication Refill - Medication: levothyroxine (SYNTHROID) 50 MCG tablet and simvastatin (ZOCOR) 40 MG tablet   Has the patient contacted their pharmacy? Yes.    Preferred Pharmacy (with phone number or street name):  [facility] [address] [place] - [address] Phone: [phone]  Fax: [phone]     Has the patient been seen for an appointment in the last year OR does the patient have an upcoming appointment? Yes.    Agent: Please be advised that RX refills may take up to 3 business days. We ask that you follow-up with your pharmacy.

## 2023-05-10 NOTE — Progress Notes (Signed)
Contacted via MyChart   Pathology returned noting only skin tags, no cancer:)

## 2023-05-10 NOTE — Telephone Encounter (Signed)
Requested Prescriptions  Pending Prescriptions Disp Refills   levothyroxine (SYNTHROID) 50 MCG tablet [Pharmacy Med Name: Levothyroxine Sodium 50 MCG Oral Tablet] 90 tablet 1    Sig: Take 1 tablet by mouth once daily     Endocrinology:  Hypothyroid Agents Passed - 05/09/2023  8:32 AM      Passed - TSH in normal range and within 360 days    TSH  Date Value Ref Range Status  04/05/2023 2.590 0.450 - 4.500 uIU/mL Final         Passed - Valid encounter within last 12 months    Recent Outpatient Visits           5 days ago Stage 3a chronic kidney disease (HCC)   [place] [facility] [place], Corrie Dandy T, NP   1 month ago Stage 3a chronic kidney disease (HCC)   [place] [facility] [place], Dorie Rank, NP   9 months ago Morbid obesity (HCC)   [place] [facility] [place], Corrie Dandy T, NP   11 months ago Morbid obesity (HCC)   [place] [facility] [place], Corrie Dandy T, NP   1 year ago Stage 3a chronic kidney disease (HCC)   [place] [facility] [place], Dorie Rank, NP       Future Appointments             In 4 months Cannady, Dorie Rank, NP  [facility]

## 2023-05-11 MED ORDER — SIMVASTATIN 40 MG PO TABS: 40.0000 mg | ORAL_TABLET | Freq: Every day | ORAL | 1 refills | Status: DC

## 2023-05-11 NOTE — Telephone Encounter (Signed)
Requested Prescriptions  Pending Prescriptions Disp Refills   simvastatin (ZOCOR) 40 MG tablet 90 tablet 1    Sig: Take 1 tablet (40 mg total) by mouth at bedtime.     Cardiovascular:  Antilipid - Statins Failed - 05/10/2023 10:37 AM      Failed - Lipid Panel in normal range within the last 12 months    Cholesterol, Total  Date Value Ref Range Status  04/05/2023 172 100 - 199 mg/dL Final   LDL Chol Calc (NIH)  Date Value Ref Range Status  04/05/2023 97 0 - 99 mg/dL Final   HDL  Date Value Ref Range Status  04/05/2023 46 >39 mg/dL Final   Triglycerides  Date Value Ref Range Status  04/05/2023 166 (H) 0 - 149 mg/dL Final         Passed - Patient is not pregnant      Passed - Valid encounter within last 12 months    Recent Outpatient Visits           6 days ago Stage 3a chronic kidney disease (HCC)   [place] [facility] Cannady, Jolene T, NP   1 month ago Stage 3a chronic kidney disease (HCC)   [place] [facility] Cannady, Jolene T, NP   10 months ago Morbid obesity (HCC)   [place] [facility] [place], Corrie Dandy T, NP   11 months ago Morbid obesity (HCC)   [place] [facility] [place], Corrie Dandy T, NP   1 year ago Stage 3a chronic kidney disease (HCC)   [place] [facility] [place], Dorie Rank, NP       Future Appointments             In 4 months Cannady, Dorie Rank, NP [place] [facility]

## 2023-07-03 ENCOUNTER — Other Ambulatory Visit: Payer: Self-pay | Admitting: Nurse Practitioner

## 2023-07-04 NOTE — Telephone Encounter (Signed)
Requested Prescriptions  Pending Prescriptions Disp Refills   olmesartan (BENICAR) 5 MG tablet [Pharmacy Med Name: Olmesartan Medoxomil 5 MG Oral Tablet] 45 tablet 0    Sig: Take 1 tablet by mouth once daily     Cardiovascular:  Angiotensin Receptor Blockers Failed - 07/03/2023  8:48 AM      Failed - Cr in normal range and within 180 days    Creatinine, Ser  Date Value Ref Range Status  05/05/2023 1.18 (H) 0.57 - 1.00 mg/dL Final         Passed - K in normal range and within 180 days    Potassium  Date Value Ref Range Status  05/05/2023 4.4 3.5 - 5.2 mmol/L Final         Passed - Patient is not pregnant      Passed - Last BP in normal range    BP Readings from Last 1 Encounters:  05/05/23 128/76         Passed - Valid encounter within last 6 months    Recent Outpatient Visits           2 months ago Stage 3a chronic kidney disease (HCC)   [place] [facility] [place], Corrie Dandy T, NP   3 months ago Stage 3a chronic kidney disease (HCC)   [place] [facility] [place], Dorie Rank, NP   11 months ago Morbid obesity (HCC)   [place] [facility] [place], Corrie Dandy T, NP   1 year ago Morbid obesity (HCC)   [place] [facility] [place], Corrie Dandy T, NP   1 year ago Stage 3a chronic kidney disease (HCC)    [facility] [place], Dorie Rank, NP       Future Appointments             In 2 months Cannady, Dorie Rank, NP  [facility]

## 2023-08-03 ENCOUNTER — Telehealth: Payer: Self-pay | Admitting: Nurse Practitioner

## 2023-08-03 NOTE — Telephone Encounter (Signed)
Pharmacy stated they cannot get medication levothyroxine (SYNTHROID) 50 MCG tablet from the manufacturer as it is out of stock. Pharmacy is asking for permission from PCP to change manufacturer to be able to fill patients Rx.  Please advise.

## 2023-08-04 NOTE — Telephone Encounter (Signed)
Spoke with patient's local pharmacy and provided verbal OK to change order per Aura Dials, NP. Pharmacist technician verbalized understanding.

## 2023-08-17 ENCOUNTER — Other Ambulatory Visit: Payer: Self-pay | Admitting: Nurse Practitioner

## 2023-08-18 NOTE — Telephone Encounter (Signed)
Requested Prescriptions  Pending Prescriptions Disp Refills   olmesartan (BENICAR) 5 MG tablet [Pharmacy Med Name: Olmesartan Medoxomil 5 MG Oral Tablet] 45 tablet 0    Sig: Take 1 tablet by mouth once daily     Cardiovascular:  Angiotensin Receptor Blockers Failed - 08/17/2023  4:01 PM      Failed - Cr in normal range and within 180 days    Creatinine, Ser  Date Value Ref Range Status  05/05/2023 1.18 (H) 0.57 - 1.00 mg/dL Final         Passed - K in normal range and within 180 days    Potassium  Date Value Ref Range Status  05/05/2023 4.4 3.5 - 5.2 mmol/L Final         Passed - Patient is not pregnant      Passed - Last BP in normal range    BP Readings from Last 1 Encounters:  05/05/23 128/76         Passed - Valid encounter within last 6 months    Recent Outpatient Visits           3 months ago Stage 3a chronic kidney disease (HCC)   [place] [facility] [place], Corrie Dandy T, NP   4 months ago Stage 3a chronic kidney disease (HCC)   [place] [facility] [place], Corrie Dandy T, NP   1 year ago Morbid obesity (HCC)   [place] [facility] [place], Corrie Dandy T, NP   1 year ago Morbid obesity (HCC)   [place] [facility] [place], Corrie Dandy T, NP   1 year ago Stage 3a chronic kidney disease (HCC)   [place] [facility] [place], Dorie Rank, NP       Future Appointments             In 1 month Cannady, Dorie Rank, NP  [facility]

## 2023-09-01 ENCOUNTER — Ambulatory Visit: Payer: Medicare Other | Admitting: Family Medicine

## 2023-09-01 ENCOUNTER — Encounter: Payer: Self-pay | Admitting: Family Medicine

## 2023-09-01 VITALS — BP 121/77 | HR 77 | Ht 67.5 in | Wt 265.8 lb

## 2023-09-01 DIAGNOSIS — J040 Acute laryngitis: Secondary | ICD-10-CM | POA: Diagnosis not present

## 2023-09-01 DIAGNOSIS — J189 Pneumonia, unspecified organism: Secondary | ICD-10-CM

## 2023-09-01 MED ORDER — AZITHROMYCIN 250 MG PO TABS: ORAL_TABLET | ORAL | 0 refills | Status: AC

## 2023-09-01 MED ORDER — PREDNISONE 10 MG PO TABS: ORAL_TABLET | ORAL | 0 refills | Status: DC

## 2023-09-01 NOTE — Progress Notes (Signed)
BP 121/77   Pulse 77   Ht 5' 7.5" (1.715 m)   Wt 265 lb 12.8 oz (120.6 kg)   LMP  (LMP Unknown)   SpO2 97%   BMI 41.02 kg/m    Subjective:    Patient ID: Wanda White, female    DOB: 1946-09-10, 76 y.o.   MRN: [id number]  HPI: Wanda White is a 77 y.o. female  Chief Complaint  Patient presents with   Hoarse    Patient says she has been symptomatic Monday, and says that her voice has gotten worse yesterday. Patient says she is having some drainage from her nose. Patient says she has no other symptoms and took an at-home covid test Wednesday and it was negative. Patient says she has tried over the counter and [employer].    UPPER RESPIRATORY TRACT INFECTION Duration: yesterday Worst symptom: laryngitis and cough Fever: no Cough: yes Shortness of breath: no Wheezing: yes Chest pain: no Chest tightness: no Chest congestion: no Nasal congestion: yes Runny nose: yes Post nasal drip: yes Sneezing: no Sore throat: yes Swollen glands: no Sinus pressure: no Headache: no Face pain: no Toothache: no Ear pain: no  Ear pressure: yes "right Eyes red/itching:yes Eye drainage/crusting: no  Vomiting: no Rash: no Fatigue: yes Sick contacts: yes Strep contacts: no  Context: worse Recurrent sinusitis: no Relief with OTC cold/cough medications: no  Treatments attempted:    Relevant past medical, surgical, family and social history reviewed and updated as indicated. Interim medical history since our last visit reviewed. Allergies and medications reviewed and updated.  Review of Systems  Constitutional: Negative.   HENT:  Positive for congestion, sore throat and voice change. Negative for dental problem, drooling, ear discharge, ear pain, facial swelling, hearing loss, mouth sores, nosebleeds, postnasal drip, rhinorrhea, sinus pressure, sinus pain, sneezing, tinnitus and trouble swallowing.   Eyes: Negative.   Respiratory:  Positive for cough and wheezing. Negative  for apnea, choking, chest tightness, shortness of breath and stridor.   Cardiovascular: Negative.   Gastrointestinal: Negative.   Musculoskeletal: Negative.   Psychiatric/Behavioral: Negative.      Per HPI unless specifically indicated above     Objective:    BP 121/77   Pulse 77   Ht 5' 7.5" (1.715 m)   Wt 265 lb 12.8 oz (120.6 kg)   LMP  (LMP Unknown)   SpO2 97%   BMI 41.02 kg/m   Wt Readings from Last 3 Encounters:  09/01/23 265 lb 12.8 oz (120.6 kg)  05/05/23 262 lb 12.8 oz (119.2 kg)  04/05/23 262 lb 12.8 oz (119.2 kg)    Physical Exam Vitals and nursing note reviewed.  Constitutional:      General: She is not in acute distress.    Appearance: Normal appearance. She is not ill-appearing, toxic-appearing or diaphoretic.  HENT:     Head: Normocephalic and atraumatic.     Right Ear: Tympanic membrane, ear canal and external ear normal. There is no impacted cerumen.     Left Ear: Tympanic membrane, ear canal and external ear normal. There is no impacted cerumen.     Nose: Nose normal. No congestion or rhinorrhea.     Mouth/Throat:     Mouth: Mucous membranes are moist.     Pharynx: Oropharynx is clear. No oropharyngeal exudate or posterior oropharyngeal erythema.  Eyes:     General: No scleral icterus.       Right eye: No discharge.  Left eye: No discharge.     Extraocular Movements: Extraocular movements intact.     Conjunctiva/sclera: Conjunctivae normal.     Pupils: Pupils are equal, round, and reactive to light.  Cardiovascular:     Rate and Rhythm: Normal rate and regular rhythm.     Pulses: Normal pulses.     Heart sounds: Normal heart sounds. No murmur heard.    No friction rub. No gallop.  Pulmonary:     Effort: Pulmonary effort is normal. No respiratory distress.     Breath sounds: No stridor. Rhonchi (fine widespread rhonchi) present. No wheezing or rales.  Chest:     Chest wall: No tenderness.  Musculoskeletal:        General: Normal range of  motion.     Cervical back: Normal range of motion and neck supple.  Skin:    General: Skin is warm and dry.     Capillary Refill: Capillary refill takes less than 2 seconds.     Coloration: Skin is not jaundiced or pale.     Findings: No bruising, erythema, lesion or rash.  Neurological:     General: No focal deficit present.     Mental Status: She is alert and oriented to person, place, and time. Mental status is at baseline.  Psychiatric:        Mood and Affect: Mood normal.        Behavior: Behavior normal.        Thought Content: Thought content normal.        Judgment: Judgment normal.     Results for orders placed or performed in visit on 05/05/23  Basic metabolic panel  Result Value Ref Range   Glucose 104 (H) 70 - 99 mg/dL   BUN 14 8 - 27 mg/dL   Creatinine, Ser 1.61 (H) 0.57 - 1.00 mg/dL   eGFR 48 (L) >09 UE/AVW/0.98   BUN/Creatinine Ratio 12 12 - 28   Sodium 143 134 - 144 mmol/L   Potassium 4.4 3.5 - 5.2 mmol/L   Chloride 104 96 - 106 mmol/L   CO2 23 20 - 29 mmol/L   Calcium 9.9 8.7 - 10.3 mg/dL  Surgical pathology  Result Value Ref Range   SURGICAL PATHOLOGY      SURGICAL PATHOLOGY CASE: [id number] PATIENT: [name] Surgical Pathology Report     Clinical History: inflamed skin tags (cm)     FINAL MICROSCOPIC DIAGNOSIS:  A. SKIN TAG, LEFT SIDE AND ANTERIOR NECK, EXCISION:      Seborrheic keratosis.      Negative for malignancy.   GROSS DESCRIPTION:  Specimen is received in formalin and consists of a 1.1 x 0.6 x 0.4 cm nodular portion of tan-gray crusted possible tissue, and a 0.6 x 0.4 x 0.2 cm aggregate of tan-gray crusted tissue fragments.  The largest piece of tissue is bisected, and the specimen is entirely submitted in 1 cassette.  Lovey Newcomer 05/09/2023)    Final Diagnosis performed by Lance Coon, MD.   Electronically signed 05/10/2023 Technical and / or Professional components performed at [facility]. [facility], [address] [place], [place] [address].  Immunohistochemistry Technical component (if applicable) was performed at [facility]. [address], [address], [place], [place] [address].   IMMUNOHISTOCHEMISTRY DISCLAIMER (if applicable): Some of these immunohistochemical stains may have been developed and the performance characteristics determine by [facility]. Some may not have been cleared or approved by the U.S. Food and Drug Administration. The  FDA has determined that such clearance or approval is not necessary. This test is used for clinical purposes. It should not be regarded as investigational or for research. This laboratory is certified under the Clinical Laboratory Improvement Amendments of 1988 (CLIA-88) as qualified to perform high complexity clinical laboratory testing.  The controls stained appropriately.   IHC stains are performed on formalin fixed, paraffin embedded tissue using a 3,3"diaminobenzidine (DAB) chromogen and Leica Bond Autostainer System. The staining intensity of the nucleus is score manually and is reported as the percentage of tumor cell nuclei demonstrating specific nuclear staining. The specime ns are fixed in 10% Neutral Formalin for at least 6 hours and up to 72hrs. These tests are validated on decalcified tissue. Results should be interpreted with caution given the possibility of false negative results on decalcified specimens. Antibody Clones are as follows ER-clone [id number], PR-clone 16, Ki67- clone MM1. Some of these immunohistochemical stains may have been developed and the performance characteristics determined by [facility].       Assessment & Plan:   Problem List Items Addressed This Visit   None Visit Diagnoses     Atypical pneumonia    -  Primary   Will treat with azithromycin. Call with any concerns or if not getting better. Continue to monitor.   Relevant Medications   azithromycin (ZITHROMAX) 250 MG tablet    Laryngitis       Will treat with prednisone and check flu, covid and strep. Call with any concerns.   Relevant Orders   Veritor Flu A/B Waived   Rapid Strep Screen ([facility] ONLY)   Novel Coronavirus, NAA ([facility])        Follow up plan: Return for As scheduled.

## 2023-09-02 LAB — NOVEL CORONAVIRUS, NAA: SARS-CoV-2, NAA: NOT DETECTED

## 2023-09-04 LAB — VERITOR FLU A/B WAIVED
Influenza A: NEGATIVE
Influenza B: NEGATIVE

## 2023-09-04 LAB — CULTURE, GROUP A STREP

## 2023-09-04 LAB — RAPID STREP SCREEN (MED CTR MEBANE ONLY): Strep Gp A Ag, IA W/Reflex: NEGATIVE

## 2023-09-12 ENCOUNTER — Encounter: Payer: Self-pay | Admitting: Family Medicine

## 2023-09-12 MED ORDER — PROMETHAZINE-DM 6.25-15 MG/5ML PO SYRP: 5.0000 mL | ORAL_SOLUTION | Freq: Four times a day (QID) | ORAL | 0 refills | Status: DC | PRN

## 2023-09-28 ENCOUNTER — Encounter: Payer: Medicare Other | Admitting: Nurse Practitioner

## 2023-10-06 ENCOUNTER — Telehealth: Payer: Self-pay | Admitting: Nurse Practitioner

## 2023-10-06 NOTE — Telephone Encounter (Signed)
 Copied from CRM [id number]. Topic: Medicare AWV >> Oct 06, 2023 11:12 AM Nathanel DEL wrote: Reason for CRM: Called LVM 10/06/2023 to schedule AWV. Please schedule office or virtual visits  Nathanel Paschal; Care Guide Ambulatory Clinical Support [facility] [facility] Direct Dial: [phone]

## 2023-10-30 ENCOUNTER — Other Ambulatory Visit: Payer: Self-pay | Admitting: Nurse Practitioner

## 2023-10-31 NOTE — Telephone Encounter (Signed)
 Requested Prescriptions  Pending Prescriptions Disp Refills   levothyroxine  (SYNTHROID ) 50 MCG tablet [Pharmacy Med Name: Levothyroxine  Sodium 50 MCG Oral Tablet] 90 tablet 1    Sig: Take 1 tablet by mouth once daily     Endocrinology:  Hypothyroid Agents Passed - 10/31/2023  3:26 PM      Passed - TSH in normal range and within 360 days    TSH  Date Value Ref Range Status  04/05/2023 2.590 0.450 - 4.500 uIU/mL Final         Passed - Valid encounter within last 12 months    Recent Outpatient Visits           2 months ago Atypical pneumonia   [facility] [place], Megan P, DO   5 months ago Stage 3a chronic kidney disease (HCC)   [place] [facility] [place], Melanie T, NP   6 months ago Stage 3a chronic kidney disease (HCC)   [place] [facility] [place], Melanie T, NP   1 year ago Morbid obesity (HCC)   [place] [facility] [place], Melanie T, NP   1 year ago Morbid obesity (HCC)   [place] [facility] [place], Melanie DASEN, NP       Future Appointments             In 2 weeks Cannady, Jolene T, NP [place] [facility]

## 2023-11-03 ENCOUNTER — Encounter: Payer: Self-pay | Admitting: Nurse Practitioner

## 2023-11-16 ENCOUNTER — Encounter: Payer: Medicare Other | Admitting: Nurse Practitioner

## 2023-11-16 DIAGNOSIS — I10 Essential (primary) hypertension: Secondary | ICD-10-CM

## 2023-11-16 DIAGNOSIS — N1831 Chronic kidney disease, stage 3a: Secondary | ICD-10-CM

## 2023-11-16 DIAGNOSIS — Z0000 Encounter for general adult medical examination without abnormal findings: Secondary | ICD-10-CM

## 2023-11-16 DIAGNOSIS — R7309 Other abnormal glucose: Secondary | ICD-10-CM

## 2023-11-16 DIAGNOSIS — M8588 Other specified disorders of bone density and structure, other site: Secondary | ICD-10-CM

## 2023-11-16 DIAGNOSIS — J4520 Mild intermittent asthma, uncomplicated: Secondary | ICD-10-CM

## 2023-11-16 DIAGNOSIS — E063 Autoimmune thyroiditis: Secondary | ICD-10-CM

## 2023-11-16 DIAGNOSIS — E782 Mixed hyperlipidemia: Secondary | ICD-10-CM

## 2023-11-16 DIAGNOSIS — D696 Thrombocytopenia, unspecified: Secondary | ICD-10-CM

## 2023-11-16 DIAGNOSIS — Z23 Encounter for immunization: Secondary | ICD-10-CM

## 2023-11-21 ENCOUNTER — Other Ambulatory Visit: Payer: Self-pay | Admitting: Nurse Practitioner

## 2023-11-22 NOTE — Telephone Encounter (Signed)
 Requested Prescriptions  Pending Prescriptions Disp Refills   olmesartan (BENICAR) 5 MG tablet [Pharmacy Med Name: Olmesartan Medoxomil 5 MG Oral Tablet] 90 tablet 0    Sig: Take 1 tablet by mouth once daily     Cardiovascular:  Angiotensin Receptor Blockers Failed - 11/22/2023  9:41 AM      Failed - Cr in normal range and within 180 days    Creatinine, Ser  Date Value Ref Range Status  05/05/2023 1.18 (H) 0.57 - 1.00 mg/dL Final         Failed - K in normal range and within 180 days    Potassium  Date Value Ref Range Status  05/05/2023 4.4 3.5 - 5.2 mmol/L Final         Passed - Patient is not pregnant      Passed - Last BP in normal range    BP Readings from Last 1 Encounters:  09/01/23 121/77         Passed - Valid encounter within last 6 months    Recent Outpatient Visits           2 months ago Atypical pneumonia   [facility] [place], Megan P, DO   6 months ago Stage 3a chronic kidney disease (HCC)   [place] [facility] [place], Corrie Dandy T, NP   7 months ago Stage 3a chronic kidney disease (HCC)   [place] [facility] [place], [place] T, NP   1 year ago Morbid obesity (HCC)   [place] [facility] [place], [place] T, NP   1 year ago Morbid obesity (HCC)   [place] [facility] [place], Wadley T, NP               simvastatin (ZOCOR) 40 MG tablet [Pharmacy Med Name: Simvastatin 40 MG Oral Tablet] 90 tablet 0    Sig: TAKE 1 TABLET BY MOUTH AT BEDTIME     Cardiovascular:  Antilipid - Statins Failed - 11/22/2023  9:41 AM      Failed - Lipid Panel in normal range within the last 12 months    Cholesterol, Total  Date Value Ref Range Status  04/05/2023 172 100 - 199 mg/dL Final   LDL Chol Calc (NIH)  Date Value Ref Range Status  04/05/2023 97 0 - 99 mg/dL Final   HDL  Date Value Ref Range Status  04/05/2023 46 >39 mg/dL Final   Triglycerides  Date Value Ref Range Status   04/05/2023 166 (H) 0 - 149 mg/dL Final         Passed - Patient is not pregnant      Passed - Valid encounter within last 12 months    Recent Outpatient Visits           2 months ago Atypical pneumonia   [place] [facility] [place], Megan P, DO   6 months ago Stage 3a chronic kidney disease (HCC)   [place] [facility] [place], Jolene T, NP   7 months ago Stage 3a chronic kidney disease (HCC)   [place] [facility] [place], Corrie Dandy T, NP   1 year ago Morbid obesity (HCC)   [place] [facility] [place], Corrie Dandy T, NP   1 year ago Morbid obesity [facility])   [place] [facility] [place], Dorie Rank, NP

## 2023-12-07 ENCOUNTER — Ambulatory Visit: Payer: Medicare Other | Admitting: Nurse Practitioner

## 2023-12-07 DIAGNOSIS — Z0000 Encounter for general adult medical examination without abnormal findings: Secondary | ICD-10-CM

## 2023-12-07 DIAGNOSIS — E782 Mixed hyperlipidemia: Secondary | ICD-10-CM

## 2023-12-07 DIAGNOSIS — I10 Essential (primary) hypertension: Secondary | ICD-10-CM

## 2023-12-07 DIAGNOSIS — E559 Vitamin D deficiency, unspecified: Secondary | ICD-10-CM

## 2023-12-07 DIAGNOSIS — J4520 Mild intermittent asthma, uncomplicated: Secondary | ICD-10-CM

## 2023-12-07 DIAGNOSIS — R7309 Other abnormal glucose: Secondary | ICD-10-CM

## 2023-12-07 DIAGNOSIS — E063 Autoimmune thyroiditis: Secondary | ICD-10-CM

## 2023-12-07 DIAGNOSIS — M8588 Other specified disorders of bone density and structure, other site: Secondary | ICD-10-CM

## 2023-12-07 DIAGNOSIS — D696 Thrombocytopenia, unspecified: Secondary | ICD-10-CM

## 2023-12-07 DIAGNOSIS — N1831 Chronic kidney disease, stage 3a: Secondary | ICD-10-CM

## 2024-02-17 ENCOUNTER — Other Ambulatory Visit: Payer: Self-pay | Admitting: Nurse Practitioner

## 2024-02-21 NOTE — Telephone Encounter (Signed)
 Requested medication (s) are due for refill today: yes  Requested medication (s) are on the active medication list: yes  Last refill:  Benicar : 11/22/23 #90       Simvastatin : 09/20/24 #90  Future visit scheduled: no  Notes to clinic:  sent pt MyChart message to call and make appointment. Overdue lab work and OV   Requested Prescriptions  Pending Prescriptions Disp Refills   olmesartan  (BENICAR ) 5 MG tablet [Pharmacy Med Name: Olmesartan  Medoxomil 5 MG Oral Tablet] 90 tablet 0    Sig: Take 1 tablet by mouth once daily     Cardiovascular:  Angiotensin Receptor Blockers Failed - 02/21/2024 11:29 AM      Failed - Cr in normal range and within 180 days    Creatinine, Ser  Date Value Ref Range Status  05/05/2023 1.18 (H) 0.57 - 1.00 mg/dL Final         Failed - K in normal range and within 180 days    Potassium  Date Value Ref Range Status  05/05/2023 4.4 3.5 - 5.2 mmol/L Final         Failed - Valid encounter within last 6 months    Recent Outpatient Visits   None            Passed - Patient is not pregnant      Passed - Last BP in normal range    BP Readings from Last 1 Encounters:  09/01/23 121/77          simvastatin  (ZOCOR ) 40 MG tablet [Pharmacy Med Name: Simvastatin  40 MG Oral Tablet] 90 tablet 0    Sig: TAKE 1 TABLET BY MOUTH AT BEDTIME     Cardiovascular:  Antilipid - Statins Failed - 02/21/2024 11:29 AM      Failed - Valid encounter within last 12 months    Recent Outpatient Visits   None            Failed - Lipid Panel in normal range within the last 12 months    Cholesterol, Total  Date Value Ref Range Status  04/05/2023 172 100 - 199 mg/dL Final   LDL Chol Calc (NIH)  Date Value Ref Range Status  04/05/2023 97 0 - 99 mg/dL Final   HDL  Date Value Ref Range Status  04/05/2023 46 >39 mg/dL Final   Triglycerides  Date Value Ref Range Status  04/05/2023 166 (H) 0 - 149 mg/dL Final         Passed - Patient is not pregnant

## 2024-02-26 NOTE — Telephone Encounter (Signed)
 Called patient left message for patient to call office and schedule appt for med refill

## 2024-04-01 ENCOUNTER — Telehealth: Payer: Self-pay | Admitting: Nurse Practitioner

## 2024-04-01 NOTE — Telephone Encounter (Signed)
 Medications are not due to be filled until August. Will send when they are due.

## 2024-04-01 NOTE — Telephone Encounter (Signed)
 Patient has physical scheduled on 06-28-24 patient states she will need refills on:   levothyroxine  (SYNTHROID ) 50 MCG  simvastatin  (ZOCOR ) 40 MG  olmesartan  (BENICAR ) 5 MG   [facility]

## 2024-04-25 ENCOUNTER — Other Ambulatory Visit: Payer: Self-pay | Admitting: Nurse Practitioner

## 2024-04-25 ENCOUNTER — Encounter: Payer: Self-pay | Admitting: Nurse Practitioner

## 2024-04-26 MED ORDER — LEVOTHYROXINE SODIUM 50 MCG PO TABS: 50.0000 ug | ORAL_TABLET | Freq: Every day | ORAL | 1 refills | Status: DC

## 2024-04-26 NOTE — Telephone Encounter (Signed)
 Duplicate request, LRF 04/26/24 FOR 90 days.  Requested Prescriptions  Pending Prescriptions Disp Refills   levothyroxine  (SYNTHROID ) 50 MCG tablet [Pharmacy Med Name: Levothyroxine  Sodium 50 MCG Oral Tablet] 90 tablet 0    Sig: Take 1 tablet by mouth once daily     Endocrinology:  Hypothyroid Agents Failed - 04/26/2024  1:58 PM      Failed - TSH in normal range and within 360 days    TSH  Date Value Ref Range Status  04/05/2023 2.590 0.450 - 4.500 uIU/mL Final         Failed - Valid encounter within last 12 months    Recent Outpatient Visits   None     Future Appointments             In 2 months Cannady, Melanie DASEN, NP [place] [facility], [facility]

## 2024-05-01 ENCOUNTER — Ambulatory Visit: Admitting: Nurse Practitioner

## 2024-05-22 ENCOUNTER — Other Ambulatory Visit: Payer: Self-pay | Admitting: Nurse Practitioner

## 2024-05-23 NOTE — Telephone Encounter (Signed)
 Requested medications are due for refill today.  yes  Requested medications are on the active medications list.  yes  Last refill. 02/21/2024 #90 0 rf  Future visit scheduled.   yes  Notes to clinic.  Labs are expired.    Requested Prescriptions  Pending Prescriptions Disp Refills   olmesartan  (BENICAR ) 5 MG tablet [Pharmacy Med Name: Olmesartan  Medoxomil 5 MG Oral Tablet] 90 tablet 0    Sig: Take 1 tablet by mouth once daily     Cardiovascular:  Angiotensin Receptor Blockers Failed - 05/23/2024  5:14 PM      Failed - Cr in normal range and within 180 days    Creatinine, Ser  Date Value Ref Range Status  05/05/2023 1.18 (H) 0.57 - 1.00 mg/dL Final         Failed - K in normal range and within 180 days    Potassium  Date Value Ref Range Status  05/05/2023 4.4 3.5 - 5.2 mmol/L Final         Failed - Valid encounter within last 6 months    Recent Outpatient Visits   None     Future Appointments             In 1 month Cannady, Melanie DASEN, NP [place] [facility], [address] - Patient is not pregnant      Passed - Last BP in normal range    BP Readings from Last 1 Encounters:  09/01/23 121/77          simvastatin  (ZOCOR ) 40 MG tablet [Pharmacy Med Name: Simvastatin  40 MG Oral Tablet] 90 tablet 0    Sig: TAKE 1 TABLET BY MOUTH AT BEDTIME     Cardiovascular:  Antilipid - Statins Failed - 05/23/2024  5:14 PM      Failed - Valid encounter within last 12 months    Recent Outpatient Visits   None     Future Appointments             In 1 month Cannady, Melanie DASEN, NP [place] [facility], [address]            Failed - Lipid Panel in normal range within the last 12 months    Cholesterol, Total  Date Value Ref Range Status  04/05/2023 172 100 - 199 mg/dL Final   LDL Chol Calc (NIH)  Date Value Ref Range Status  04/05/2023 97 0 - 99 mg/dL Final   HDL  Date Value Ref Range Status  04/05/2023 46 >39  mg/dL Final   Triglycerides  Date Value Ref Range Status  04/05/2023 166 (H) 0 - 149 mg/dL Final         Passed - Patient is not pregnant

## 2024-05-24 ENCOUNTER — Encounter: Payer: Self-pay | Admitting: Nurse Practitioner

## 2024-05-30 MED ORDER — SIMVASTATIN 40 MG PO TABS: 40.0000 mg | ORAL_TABLET | Freq: Every day | ORAL | 2 refills | Status: DC

## 2024-05-30 MED ORDER — OLMESARTAN MEDOXOMIL 5 MG PO TABS: 5.0000 mg | ORAL_TABLET | Freq: Every day | ORAL | 2 refills | Status: AC

## 2024-06-28 ENCOUNTER — Ambulatory Visit: Payer: Self-pay | Admitting: Nurse Practitioner

## 2024-06-28 ENCOUNTER — Ambulatory Visit: Admitting: Nurse Practitioner

## 2024-06-28 ENCOUNTER — Encounter: Payer: Self-pay | Admitting: Nurse Practitioner

## 2024-06-28 VITALS — BP 123/77 | HR 79 | Temp 98.4°F | Resp 15 | Ht 67.52 in | Wt 267.6 lb

## 2024-06-28 DIAGNOSIS — I10 Essential (primary) hypertension: Secondary | ICD-10-CM

## 2024-06-28 DIAGNOSIS — E782 Mixed hyperlipidemia: Secondary | ICD-10-CM

## 2024-06-28 DIAGNOSIS — Z1231 Encounter for screening mammogram for malignant neoplasm of breast: Secondary | ICD-10-CM

## 2024-06-28 DIAGNOSIS — Z23 Encounter for immunization: Secondary | ICD-10-CM | POA: Diagnosis not present

## 2024-06-28 DIAGNOSIS — Z0000 Encounter for general adult medical examination without abnormal findings: Secondary | ICD-10-CM | POA: Diagnosis not present

## 2024-06-28 DIAGNOSIS — N1831 Chronic kidney disease, stage 3a: Secondary | ICD-10-CM | POA: Diagnosis not present

## 2024-06-28 DIAGNOSIS — R7309 Other abnormal glucose: Secondary | ICD-10-CM

## 2024-06-28 DIAGNOSIS — R200 Anesthesia of skin: Secondary | ICD-10-CM

## 2024-06-28 DIAGNOSIS — J4520 Mild intermittent asthma, uncomplicated: Secondary | ICD-10-CM

## 2024-06-28 DIAGNOSIS — E063 Autoimmune thyroiditis: Secondary | ICD-10-CM

## 2024-06-28 DIAGNOSIS — M8588 Other specified disorders of bone density and structure, other site: Secondary | ICD-10-CM

## 2024-06-28 LAB — MICROALBUMIN, URINE WAIVED
Creatinine, Urine Waived: 200 mg/dL (ref 10–300)
Microalb, Ur Waived: 150 mg/L — ABNORMAL HIGH (ref 0–19)

## 2024-06-28 LAB — BAYER DCA HB A1C WAIVED: HB A1C (BAYER DCA - WAIVED): 5.8 % — ABNORMAL HIGH (ref 4.8–5.6)

## 2024-06-28 NOTE — Assessment & Plan Note (Signed)
Noted on DEXA in 2016 and 2023. Continue supplements at home.  Discussed with patient. Repeat DEXA in 2028.  Vitamin D level today.

## 2024-06-28 NOTE — Assessment & Plan Note (Signed)
Chronic, stable.  Adjust Levothyroxine dose as needed, at this time continue current dosing.  Thyroid labs today.

## 2024-06-28 NOTE — Patient Instructions (Signed)
 Be Involved in Caring For Your Health:  Taking Medications When medications are taken as directed, they can greatly improve your health. But if they are not taken as prescribed, they may not work. In some cases, not taking them correctly can be harmful. To help ensure your treatment remains effective and safe, understand your medications and how to take them. Bring your medications to each visit for review by your provider.  Your lab results, notes, and after visit summary will be available on My Chart. We strongly encourage you to use this feature. If lab results are abnormal the clinic will contact you with the appropriate steps. If the clinic does not contact you assume the results are satisfactory. You can always view your results on My Chart. If you have questions regarding your health or results, please contact the clinic during office hours. You can also ask questions on My Chart.  We at [facility] are grateful that you chose us  to provide your care. We strive to provide evidence-based and compassionate care and are always looking for feedback. If you get a survey from the clinic please complete this so we can hear your opinions.  Healthy Eating, Adult Healthy eating may help you get and keep a healthy body weight, reduce the risk of chronic disease, and live a long and productive life. It is important to follow a healthy eating pattern. Your nutritional and calorie needs should be met mainly by different nutrient-rich foods. What are tips for following this plan? Reading food labels Read labels and choose the following: Reduced or low sodium products. Juices with 100% fruit juice. Foods with low saturated fats (<3 g per serving) and high polyunsaturated and monounsaturated fats. Foods with whole grains, such as whole wheat, cracked wheat, brown rice, and wild rice. Whole grains that are fortified with folic acid. This is recommended for females who are pregnant or who want to  become pregnant. Read labels and do not eat or drink the following: Foods or drinks with added sugars. These include foods that contain brown sugar, corn sweetener, corn syrup, dextrose , fructose, glucose, high-fructose corn syrup, honey, invert sugar, lactose, malt syrup, maltose, molasses, raw sugar, sucrose, trehalose, or turbinado sugar. Limit your intake of added sugars to less than 10% of your total daily calories. Do not eat more than the following amounts of added sugar per day: 6 teaspoons (25 g) for females. 9 teaspoons (38 g) for males. Foods that contain processed or refined starches and grains. Refined grain products, such as white flour, degermed cornmeal, white bread, and white rice. Shopping Choose nutrient-rich snacks, such as vegetables, whole fruits, and nuts. Avoid high-calorie and high-sugar snacks, such as potato chips, fruit snacks, and candy. Use oil-based dressings and spreads on foods instead of solid fats such as butter, margarine, sour cream, or cream cheese. Limit pre-made sauces, mixes, and instant products such as flavored rice, instant noodles, and ready-made pasta. Try more plant-protein sources, such as tofu, tempeh, black beans, edamame, lentils, nuts, and seeds. Explore eating plans such as the Mediterranean diet or vegetarian diet. Try heart-healthy dips made with beans and healthy fats like hummus and guacamole. Vegetables go great with these. Cooking Use oil to saut or stir-fry foods instead of solid fats such as butter, margarine, or lard. Try baking, boiling, grilling, or broiling instead of frying. Remove the fatty part of meats before cooking. Steam vegetables in water  or broth. Meal planning  At meals, imagine dividing your plate into fourths: One-half of  your plate is fruits and vegetables. One-fourth of your plate is whole grains. One-fourth of your plate is protein, especially lean meats, poultry, eggs, tofu, beans, or nuts. Include low-fat  dairy as part of your daily diet. Lifestyle Choose healthy options in all settings, including home, work, school, restaurants, or stores. Prepare your food safely: Wash your hands after handling raw meats. Where you prepare food, keep surfaces clean by regularly washing with hot, soapy water . Keep raw meats separate from ready-to-eat foods, such as fruits and vegetables. Cook seafood, meat, poultry, and eggs to the recommended temperature. Get a food thermometer. Store foods at safe temperatures. In general: Keep cold foods at 84F (4.4C) or below. Keep hot foods at 184F (60C) or above. Keep your freezer at [facility] (-17.8C) or below. Foods are not safe to eat if they have been between the temperatures of 40-184F (4.4-60C) for more than 2 hours. What foods should I eat? Fruits Aim to eat 1-2 cups of fresh, canned (in natural juice), or frozen fruits each day. One cup of fruit equals 1 small apple, 1 large banana, 8 large strawberries, 1 cup (237 g) canned fruit,  cup (82 g) dried fruit, or 1 cup (240 mL) 100% juice. Vegetables Aim to eat 2-4 cups of fresh and frozen vegetables each day, including different varieties and colors. One cup of vegetables equals 1 cup (91 g) broccoli or cauliflower florets, 2 medium carrots, 2 cups (150 g) raw, leafy greens, 1 large tomato, 1 large bell pepper, 1 large sweet potato, or 1 medium white potato. Grains Aim to eat 5-10 ounce-equivalents of whole grains each day. Examples of 1 ounce-equivalent of grains include 1 slice of bread, 1 cup (40 g) ready-to-eat cereal, 3 cups (24 g) popcorn, or  cup (93 g) cooked rice. Meats and other proteins Try to eat 5-7 ounce-equivalents of protein each day. Examples of 1 ounce-equivalent of protein include 1 egg,  oz nuts (12 almonds, 24 pistachios, or 7 walnut halves), 1/4 cup (90 g) cooked beans, 6 tablespoons (90 g) hummus or 1 tablespoon (16 g) peanut butter. A cut of meat or fish that is the size of a deck of  cards is about 3-4 ounce-equivalents (85 g). Of the protein you eat each week, try to have at least 8 sounce (227 g) of seafood. This is about 2 servings per week. This includes salmon, trout, herring, sardines, and anchovies. Dairy Aim to eat 3 cup-equivalents of fat-free or low-fat dairy each day. Examples of 1 cup-equivalent of dairy include 1 cup (240 mL) milk, 8 ounces (250 g) yogurt, 1 ounces (44 g) natural cheese, or 1 cup (240 mL) fortified soy milk. Fats and oils Aim for about 5 teaspoons (21 g) of fats and oils per day. Choose monounsaturated fats, such as canola and olive oils, mayonnaise made with olive oil or avocado oil, avocados, peanut butter, and most nuts, or polyunsaturated fats, such as sunflower, corn, and soybean oils, walnuts, pine nuts, sesame seeds, sunflower seeds, and flaxseed. Beverages Aim for 6 eight-ounce glasses of water  per day. Limit coffee to 3-5 eight-ounce cups per day. Limit caffeinated beverages that have added calories, such as soda and energy drinks. If you drink alcohol: Limit how much you have to: 0-1 drink a day if you are female. 0-2 drinks a day if you are female. Know how much alcohol is in your drink. In the [place] one drink is one 12 oz bottle of beer (355 mL), one 5 oz glass of wine (  148 mL), or one 1 oz glass of hard liquor (44 mL). Seasoning and other foods Try not to add too much salt to your food. Try using herbs and spices instead of salt. Try not to add sugar to food. This information is based on [place] nutrition guidelines. To learn more, visit [employer]. Exact amounts may vary. You may need different amounts. This information is not intended to replace advice given to you by your health care provider. Make sure you discuss any questions you have with your health care provider. Document Revised: 06/13/2022 Document Reviewed: 06/13/2022 Elsevier Patient Education  2024 [employer].

## 2024-06-28 NOTE — Assessment & Plan Note (Signed)
 BMI 41.27 with HTN and CKD.  Recommended eating smaller high protein, low fat meals more frequently and exercising 30 mins a day 5 times a week with a goal of 10-15lb weight loss in the next 3 months. Patient voiced their understanding and motivation to adhere to these recommendations.

## 2024-06-28 NOTE — Assessment & Plan Note (Signed)
 Chronic, stable. BP stable in office and at home, with CKD 3a and proteinuria.  Continue Olmesartan  5 MG as is tolerating and is offering benefit to BP.  Recommend she monitor BP at least a few mornings a week at home and document.  DASH diet at home.  Continue current medication regimen and adjust as needed.  Labs today: CBC, TSH, CMP, urine ALB.  Urine ALB 80 July 2024, recheck today.

## 2024-06-28 NOTE — Assessment & Plan Note (Signed)
Chronic, ongoing.  Continue Simvastatin and adjust as needed.  Lipid panel today. 

## 2024-06-28 NOTE — Assessment & Plan Note (Signed)
 Foot exam with normal sensation both feet, ?neuropathy vs related to lower back, however is only to feet.  Would like to see neurology, referral placed for further testing.  Check labs today. Defer medication until all testing has returned.  Continue supplements at home.

## 2024-06-28 NOTE — Assessment & Plan Note (Signed)
 Chronic, stable CKD 3a. Continue to monitor on labs. Urine ALB 80 July 2024, recheck today.  Continue Olmesartan  5 MG daily, educated her on this and side effects.  Consider nephrology referral in future if any worsening function. LABS: [facility], CBC, Urine ALB.

## 2024-06-28 NOTE — Assessment & Plan Note (Signed)
 A1c 6.3% last visit, recheck today. Remains in prediabetic range but was trending up due to dietary indiscretions.  Recommend continue diet and activity focus at home.  We will initiate medication if needed in future.  Did not tolerate Metformin  in past.

## 2024-06-28 NOTE — Progress Notes (Signed)
 BP 123/77 (BP Location: Left Arm)   Pulse 79   Temp 98.4 F (36.9 C) (Oral)   Resp 15   Ht 5' 7.52 (1.715 m)   Wt 267 lb 9.6 oz (121.4 kg)   LMP  (LMP Unknown)   SpO2 97%   BMI 41.27 kg/m    Subjective:    Patient ID: Wanda White, female    DOB: 05-Oct-1945, 78 y.o.   MRN: [id number]  HPI: Wanda White is a 78 y.o. female presenting on 06/28/2024 for comprehensive medical examination. Current medical complaints include:none  She currently lives with: husband Menopausal Symptoms: no  NEUROPATHY Continues to have burning sensation, tingling to both feet - about equal. This has been present at least 6 months. If steps on frozen peas this helps. Symptoms worse at night.  Neuropathy status: exacerbated  Location: feet Pain: yes Severity: 5/10  Quality:  burning and tingling Frequency: if on feet it is pretty constant Bilateral: yes Symmetric: yes Numbness: yes Decreased sensation: yes Weakness: no Context: stable Alleviating factors: bag of peas and rest Aggravating factors: being on feet a lot Treatments attempted: OTC creams and medications  Impaired Fasting Glucose HbA1C:  Lab Results  Component Value Date   HGBA1C 6.3 (H) 04/05/2023  Duration of elevated blood sugar: chronic Polydipsia: no Polyuria: no Weight change: no Visual disturbance: no Glucose Monitoring: no    Accucheck frequency: Not Checking    Fasting glucose:     Post prandial:  Diabetic Education: Not Completed Family history of diabetes: no   HYPERTENSION / HYPERLIPIDEMIA Continues on Olmesartan  and Simvastatin . Satisfied with current treatment? yes Duration of hypertension: chronic BP monitoring frequency: daily BP range: 118-130/70-80 BP medication side effects: no Duration of hyperlipidemia: chronic Cholesterol medication side effects: no Cholesterol supplements: none Medication compliance: good compliance Aspirin: yes Recent stressors: no Recurrent headaches: no Visual  changes: no Palpitations: no Dyspnea: no Chest pain: no Lower extremity edema: yes in both feet Dizzy/lightheaded: no  The 10-year ASCVD risk score (Arnett DK, et al., 2019) is: 21.3%   Values used to calculate the score:     Age: 78 years     Clincally relevant sex: Female     Is Non-Hispanic African American: No     Diabetic: No     Tobacco smoker: No     Systolic Blood Pressure: 123 mmHg     Is BP treated: Yes     HDL Cholesterol: 46 mg/dL     Total Cholesterol: 172 mg/dL   HYPOTHYROIDISM Continues on Levothyroxine  50 MCG daily. Thyroid  control status:controlled Satisfied with current treatment? yes Medication side effects: no Medication compliance: good compliance Etiology of hypothyroidism: Hashimoto's Recent dose adjustment:no Fatigue: occasionally Cold intolerance: no Heat intolerance: no Weight gain: no Weight loss: no Constipation: no Diarrhea/loose stools: no Palpitations: no Lower extremity edema: as above Anxiety/depressed mood: no   ASTHMA No inhalers. Asthma status: stable Satisfied with current treatment?: yes Albuterol/rescue inhaler frequency: none Dyspnea frequency: no Wheezing frequency: no Cough frequency: no Nocturnal symptom frequency: no Limitation of activity: no Current upper respiratory symptoms: no Triggers: pollen Home peak flows:none Last Spirometry: unknown Failed/intolerant to following asthma meds: no Asthma meds in past: Albuterol Aerochamber/spacer use: no Visits to ER or Urgent Care in past year: no Pneumovax: Up to Date Influenza: Up to Date   Functional Status Survey: Is the patient deaf or have difficulty hearing?: No Does the patient have difficulty seeing, even when wearing glasses/contacts?: No Does the patient have  difficulty concentrating, remembering, or making decisions?: No Does the patient have difficulty walking or climbing stairs?: No Does the patient have difficulty dressing or bathing?: No Does the  patient have difficulty doing errands alone such as visiting a doctor's office or shopping?: No   Depression Screen done today and results listed below:     06/28/2024    1:11 PM 05/05/2023    9:56 AM 04/05/2023   11:24 AM 07/15/2022    9:37 AM 07/06/2022    9:10 AM  Depression screen PHQ 2/9  Decreased Interest 0 0 0 0 0  Down, Depressed, Hopeless 0 0 0 0 0  PHQ - 2 Score 0 0 0 0 0  Altered sleeping 0 0 1 0 0  Tired, decreased energy 0 1 1 0 0  Change in appetite 0 0 0 0 0  Feeling bad or failure about yourself  0 0 0 0 0  Trouble concentrating 0 0 0 0 0  Moving slowly or fidgety/restless 0 0 0 0 0  Suicidal thoughts 0 0 0 0 0  PHQ-9 Score 0 1 2 0 0  Difficult doing work/chores  Not difficult at all Not difficult at all Not difficult at all Not difficult at all      06/28/2024    1:12 PM 05/05/2023    9:57 AM 04/05/2023   11:25 AM 07/15/2022    9:37 AM  GAD 7 : Generalized Anxiety Score  Nervous, Anxious, on Edge 0 0 0 0  Control/stop worrying 0 0 0 0  Worry too much - different things 0 0 0 0  Trouble relaxing 0 0 1 0  Restless 0 0 0 0  Easily annoyed or irritable 0 0 0 0  Afraid - awful might happen 0 0 0 0  Total GAD 7 Score 0 0 1 0  Anxiety Difficulty  Not difficult at all Not difficult at all Not difficult at all        07/06/2022    9:07 AM 07/15/2022    9:36 AM 04/05/2023   11:24 AM 05/05/2023    9:56 AM 06/28/2024    1:11 PM  Fall Risk  Falls in the past year? 0 0 0 0 0  Was there an injury with Fall? 0 0 0 0 0  Fall Risk Category Calculator 0 0 0 0 0  Fall Risk Category (Retired) Low  Low      (RETIRED) Patient Fall Risk Level Low fall risk  Low fall risk      Patient at Risk for Falls Due to  No Fall Risks No Fall Risks No Fall Risks No Fall Risks  Fall risk Follow up Falls evaluation completed;Education provided;Falls prevention discussed  Falls evaluation completed  Falls evaluation completed Falls evaluation completed Falls evaluation completed     Data  saved with a previous flowsheet row definition    Past Medical History:  Past Medical History:  Diagnosis Date   Depression    Hyperlipidemia    Hypothyroid     Surgical History:  Past Surgical History:  Procedure Laterality Date   CATARACT EXTRACTION  2004   JOINT REPLACEMENT  01/2016   left shoulder   SHOULDER SURGERY     THYROID  SURGERY     gland removed   TUBAL LIGATION      Medications:  Current Outpatient Medications on File Prior to Visit  Medication Sig   aspirin EC 81 MG tablet Take 81 mg by mouth daily.   Boswellia-Glucosamine-Vit  D (OSTEO BI-FLEX ONE PER DAY PO) Take 2 tablets by mouth daily.   Calcium Carb-Cholecalciferol (CALCIUM + D3) 600-200 MG-UNIT TABS Take 600 mg by mouth 2 (two) times daily. Reported on 01/19/2016   Cholecalciferol (VITAMIN D -3 PO) Take 1,000 Units by mouth daily. Reported on 01/19/2016   levothyroxine  (SYNTHROID ) 50 MCG tablet Take 1 tablet (50 mcg total) by mouth daily.   Magnesium 250 MG TABS Take 250 mg by mouth daily. Reported on 01/19/2016   Multiple Vitamin (MULTIVITAMIN) tablet Take 1 tablet by mouth daily. Reported on 01/19/2016   olmesartan  (BENICAR ) 5 MG tablet Take 1 tablet (5 mg total) by mouth daily.   Omega-3 Fatty Acids (FISH OIL) 1000 MG CAPS Take 1,000 mg by mouth 2 (two) times daily. Reported on 01/19/2016   simvastatin  (ZOCOR ) 40 MG tablet Take 1 tablet (40 mg total) by mouth at bedtime.   vitamin C (ASCORBIC ACID) 500 MG tablet Take 500 mg by mouth daily. Reported on 01/19/2016   vitamin E 400 UNIT capsule Take 400 Units by mouth daily. Reported on 01/19/2016   No current facility-administered medications on file prior to visit.    Allergies:  Allergies  Allergen Reactions   Amoxicillin     Yeast infection    Social History:  Social History   Socioeconomic History   Marital status: Widowed    Spouse name: Not on file   Number of children: Not on file   Years of education: Not on file   Highest education level:  12th grade  Occupational History   Not on file  Tobacco Use   Smoking status: Former    Current packs/day: 0.00    Types: Cigarettes    Start date: 09/26/1990    Quit date: 09/26/2010    Years since quitting: 13.7   Smokeless tobacco: Never  Vaping Use   Vaping status: Never Used  Substance and Sexual Activity   Alcohol use: Yes    Comment: occasionally/socially   Drug use: No   Sexual activity: Not Currently  Other Topics Concern   Not on file  Social History Narrative   Not on file   Social Drivers of Health   Financial Resource Strain: Low Risk  (04/05/2023)   Overall Financial Resource Strain (CARDIA)    Difficulty of Paying Living Expenses: Not hard at all  Food Insecurity: No Food Insecurity (04/05/2023)   Hunger Vital Sign    Worried About Running Out of Food in the Last Year: Never true    Ran Out of Food in the Last Year: Never true  Transportation Needs: No Transportation Needs (04/05/2023)   PRAPARE - Administrator, Civil Service (Medical): No    Lack of Transportation (Non-Medical): No  Physical Activity: Insufficiently Active (04/05/2023)   Exercise Vital Sign    Days of Exercise per Week: 3 days    Minutes of Exercise per Session: 10 min  Stress: No Stress Concern Present (04/05/2023)   [employer] of Occupational Health - Occupational Stress Questionnaire    Feeling of Stress : Only a little  Social Connections: Moderately Isolated (04/05/2023)   Social Connection and Isolation Panel    Frequency of Communication with Friends and Family: More than three times a week    Frequency of Social Gatherings with Friends and Family: More than three times a week    Attends Religious Services: 1 to 4 times per year    Active Member of Clubs or Organizations: No    Attends  Banker Meetings: Not on file    Marital Status: Widowed  Intimate Partner Violence: Not At Risk (07/06/2022)   Humiliation, Afraid, Rape, and Kick questionnaire    Fear  of Current or Ex-Partner: No    Emotionally Abused: No    Physically Abused: No    Sexually Abused: No   Social History   Tobacco Use  Smoking Status Former   Current packs/day: 0.00   Types: Cigarettes   Start date: 09/26/1990   Quit date: 09/26/2010   Years since quitting: 13.7  Smokeless Tobacco Never   Social History   Substance and Sexual Activity  Alcohol Use Yes   Comment: occasionally/socially    Family History:  Family History  Problem Relation Age of Onset   Cancer Mother 59       breast   Thyroid  disease Mother    CAD Father    Hypertension Father    Heart attack Father    Heart attack Maternal Grandfather    Heart disease Paternal Grandmother    Lung disease Paternal Grandfather    Cancer Sister        breast   Hypertension Sister     Past medical history, surgical history, medications, allergies, family history and social history reviewed with patient today and changes made to appropriate areas of the chart.   ROS All other ROS negative except what is listed above and in the HPI.      Objective:    BP 123/77 (BP Location: Left Arm)   Pulse 79   Temp 98.4 F (36.9 C) (Oral)   Resp 15   Ht 5' 7.52 (1.715 m)   Wt 267 lb 9.6 oz (121.4 kg)   LMP  (LMP Unknown)   SpO2 97%   BMI 41.27 kg/m   Wt Readings from Last 3 Encounters:  06/28/24 267 lb 9.6 oz (121.4 kg)  09/01/23 265 lb 12.8 oz (120.6 kg)  05/05/23 262 lb 12.8 oz (119.2 kg)    Physical Exam Vitals and nursing note reviewed. Exam conducted with a chaperone present.  Constitutional:      General: She is awake. She is not in acute distress.    Appearance: Normal appearance. She is well-developed and well-groomed. She is obese. She is not ill-appearing or toxic-appearing.  HENT:     Head: Normocephalic and atraumatic.     Right Ear: Hearing, tympanic membrane, ear canal and external ear normal. No drainage.     Left Ear: Hearing, tympanic membrane, ear canal and external ear normal. No  drainage.     Nose: Nose normal.     Right Sinus: No maxillary sinus tenderness or frontal sinus tenderness.     Left Sinus: No maxillary sinus tenderness or frontal sinus tenderness.     Mouth/Throat:     Mouth: Mucous membranes are moist.     Pharynx: Oropharynx is clear. Uvula midline. No pharyngeal swelling, oropharyngeal exudate or posterior oropharyngeal erythema.  Eyes:     General: Lids are normal.        Right eye: No discharge.        Left eye: No discharge.     Extraocular Movements: Extraocular movements intact.     Conjunctiva/sclera: Conjunctivae normal.     Pupils: Pupils are equal, round, and reactive to light.     Visual Fields: Right eye visual fields normal and left eye visual fields normal.  Neck:     Thyroid : No thyromegaly.     Vascular: No carotid  bruit.     Trachea: Trachea normal.  Cardiovascular:     Rate and Rhythm: Normal rate and regular rhythm.     Pulses:          Dorsalis pedis pulses are 2+ on the right side and 2+ on the left side.       Posterior tibial pulses are 2+ on the right side and 2+ on the left side.     Heart sounds: Normal heart sounds. No murmur heard.    No gallop.  Pulmonary:     Effort: Pulmonary effort is normal. No accessory muscle usage or respiratory distress.     Breath sounds: Normal breath sounds.  Chest:  Breasts:    Right: Normal.     Left: Normal.  Abdominal:     General: Bowel sounds are normal.     Palpations: Abdomen is soft. There is no hepatomegaly or splenomegaly.     Tenderness: There is no abdominal tenderness.  Musculoskeletal:        General: Normal range of motion.     Cervical back: Normal range of motion and neck supple.     Right lower leg: No edema.     Left lower leg: No edema.     Right foot: Normal range of motion.     Left foot: Normal range of motion.  Feet:     Right foot:     Protective Sensation: 10 sites tested.  10 sites sensed.     Skin integrity: Callus and dry skin present.      Toenail Condition: Right toenails are normal.     Left foot:     Protective Sensation: 10 sites tested.  10 sites sensed.     Skin integrity: Callus and dry skin present.     Toenail Condition: Left toenails are normal.  Lymphadenopathy:     Head:     Right side of head: No submental, submandibular, tonsillar, preauricular or posterior auricular adenopathy.     Left side of head: No submental, submandibular, tonsillar, preauricular or posterior auricular adenopathy.     Cervical: No cervical adenopathy.     Upper Body:     Right upper body: No supraclavicular, axillary or pectoral adenopathy.     Left upper body: No supraclavicular, axillary or pectoral adenopathy.  Skin:    General: Skin is warm and dry.     Capillary Refill: Capillary refill takes less than 2 seconds.     Findings: No rash.  Neurological:     Mental Status: She is alert and oriented to person, place, and time.     Gait: Gait is intact.     Deep Tendon Reflexes: Reflexes are normal and symmetric.     Reflex Scores:      Brachioradialis reflexes are 2+ on the right side and 2+ on the left side.      Patellar reflexes are 2+ on the right side and 2+ on the left side. Psychiatric:        Attention and Perception: Attention normal.        Mood and Affect: Mood normal.        Speech: Speech normal.        Behavior: Behavior normal. Behavior is cooperative.        Thought Content: Thought content normal.        Judgment: Judgment normal.    Results for orders placed or performed in visit on 09/01/23  Rapid Strep Screen ([facility] ONLY)  Collection Time: 09/01/23  2:46 PM   Specimen: Other   Other  Result Value Ref Range   Strep Gp A Ag, IA W/Reflex Negative Negative  Culture, Group A Strep   Collection Time: 09/01/23  2:46 PM   Other  Result Value Ref Range   Strep A Culture Negative   Veritor Flu A/B Waived   Collection Time: 09/01/23  2:46 PM  Result Value Ref Range   Influenza A Negative Negative    Influenza B Negative Negative  Novel Coronavirus, NAA ([facility])   Collection Time: 09/01/23  2:52 PM   Specimen: Nasopharyngeal(NP) swabs in vial transport medium  Result Value Ref Range   SARS-CoV-2, NAA Not Detected Not Detected      Assessment & Plan:   Problem List Items Addressed This Visit       Cardiovascular and Mediastinum   Hypertension   Chronic, stable. BP stable in office and at home, with CKD 3a and proteinuria.  Continue Olmesartan  5 MG as is tolerating and is offering benefit to BP.  Recommend she monitor BP at least a few mornings a week at home and document.  DASH diet at home.  Continue current medication regimen and adjust as needed.  Labs today: CBC, TSH, CMP, urine ALB.  Urine ALB 80 July 2024, recheck today.      Relevant Orders   Microalbumin, Urine Waived     Respiratory   Asthma   Chronic, stable without inhalers.  Continue to monitor.      Relevant Orders   CBC with Differential/Platelet     Endocrine   Hashimoto's thyroiditis   Chronic, stable.  Adjust Levothyroxine  dose as needed, at this time continue current dosing.  Thyroid  labs today.      Relevant Orders   TSH   T4, free     Musculoskeletal and Integument   Osteopenia of lumbar spine   Noted on DEXA in 2016 and 2023. Continue supplements at home.  Discussed with patient. Repeat DEXA in 2028.  Vitamin D  level today.      Relevant Orders   VITAMIN D  25 Hydroxy (Vit-D Deficiency, Fractures)     Genitourinary   CKD (chronic kidney disease) stage 3, GFR 30-59 ml/min (HCC) - Primary   Chronic, stable CKD 3a. Continue to monitor on labs. Urine ALB 80 July 2024, recheck today.  Continue Olmesartan  5 MG daily, educated her on this and side effects.  Consider nephrology referral in future if any worsening function. LABS: [facility], CBC, Urine ALB.      Relevant Orders   Microalbumin, Urine Waived   CBC with Differential/Platelet   Comprehensive metabolic panel with GFR     Other   Numbness  and tingling of both feet   Foot exam with normal sensation both feet, ?neuropathy vs related to lower back, however is only to feet.  Would like to see neurology, referral placed for further testing.  Check labs today. Defer medication until all testing has returned.  Continue supplements at home.      Relevant Orders   Ambulatory referral to Neurology   Vitamin B12   Morbid obesity (HCC)   BMI 41.27 with HTN and CKD.  Recommended eating smaller high protein, low fat meals more frequently and exercising 30 mins a day 5 times a week with a goal of 10-15lb weight loss in the next 3 months. Patient voiced their understanding and motivation to adhere to these recommendations.       Hyperlipidemia   Chronic,  ongoing. Continue Simvastatin  and adjust as needed. Lipid panel today.      Relevant Orders   Comprehensive metabolic panel with GFR   Lipid Panel w/o Chol/HDL Ratio   Elevated hemoglobin A1c measurement   A1c 6.3% last visit, recheck today. Remains in prediabetic range but was trending up due to dietary indiscretions.  Recommend continue diet and activity focus at home.  We will initiate medication if needed in future.  Did not tolerate Metformin  in past.      Relevant Orders   Bayer DCA Hb A1c Waived   Microalbumin, Urine Waived   Other Visit Diagnoses       Flu vaccine need       Flu vaccine today.   Relevant Orders   Flu vaccine HIGH DOSE PF(Fluzone Trivalent) (Completed)     Encounter for screening mammogram for malignant neoplasm of breast       Mammogram ordered.   Relevant Orders   MM 3D SCREENING MAMMOGRAM BILATERAL BREAST     Encounter for annual physical exam       Annual physical today, health maintenance reviewed.        Follow up plan: Return in about 6 months (around 12/27/2024) for HTN/HLD, Hypothyroid, IFG + needs Medicare Wellness with nurse scheduled.   LABORATORY TESTING:  - Pap smear: not applicable  IMMUNIZATIONS:   - Tdap: Tetanus vaccination  status reviewed: Refused. - Influenza: Administered today - Pneumovax: Up to date - Prevnar: Up to date - COVID: has had some - HPV: Not applicable - Shingrix vaccine: Refused  SCREENING: -Mammogram: Ordered today  - Colonoscopy: Not applicable  - Bone Density: Up to date  -Hearing Test: Not applicable  -Spirometry: Not applicable   PATIENT COUNSELING:   Advised to take 1 mg of folate supplement per day if capable of pregnancy.   Sexuality: Discussed sexually transmitted diseases, partner selection, use of condoms, avoidance of unintended pregnancy  and contraceptive alternatives.   Advised to avoid cigarette smoking.  I discussed with the patient that most people either abstain from alcohol or drink within safe limits (<=14/week and <=4 drinks/occasion for males, <=7/weeks and <= 3 drinks/occasion for females) and that the risk for alcohol disorders and other health effects rises proportionally with the number of drinks per week and how often a drinker exceeds daily limits.  Discussed cessation/primary prevention of drug use and availability of treatment for abuse.   Diet: Encouraged to adjust caloric intake to maintain  or achieve ideal body weight, to reduce intake of dietary saturated fat and total fat, to limit sodium intake by avoiding high sodium foods and not adding table salt, and to maintain adequate dietary potassium and calcium preferably from fresh fruits, vegetables, and low-fat dairy products.    Stressed the importance of regular exercise  Injury prevention: Discussed safety belts, safety helmets, smoke detector, smoking near bedding or upholstery.   Dental health: Discussed importance of regular tooth brushing, flossing, and dental visits.    NEXT PREVENTATIVE PHYSICAL DUE IN 1 YEAR. Return in about 6 months (around 12/27/2024) for HTN/HLD, Hypothyroid, IFG + needs Medicare Wellness with nurse scheduled.

## 2024-06-28 NOTE — Assessment & Plan Note (Signed)
Chronic, stable without inhalers.  Continue to monitor.

## 2024-06-29 LAB — COMPREHENSIVE METABOLIC PANEL WITH GFR
ALT: 20 IU/L (ref 0–32)
AST: 23 IU/L (ref 0–40)
Albumin: 4.8 g/dL (ref 3.8–4.8)
Alkaline Phosphatase: 66 IU/L (ref 49–135)
BUN/Creatinine Ratio: 11 — ABNORMAL LOW (ref 12–28)
BUN: 12 mg/dL (ref 8–27)
Bilirubin Total: 0.5 mg/dL (ref 0.0–1.2)
CO2: 24 mmol/L (ref 20–29)
Calcium: 9.9 mg/dL (ref 8.7–10.3)
Chloride: 100 mmol/L (ref 96–106)
Creatinine, Ser: 1.1 mg/dL — ABNORMAL HIGH (ref 0.57–1.00)
Globulin, Total: 2.5 g/dL (ref 1.5–4.5)
Glucose: 105 mg/dL — ABNORMAL HIGH (ref 70–99)
Potassium: 4.5 mmol/L (ref 3.5–5.2)
Sodium: 137 mmol/L (ref 134–144)
Total Protein: 7.3 g/dL (ref 6.0–8.5)
eGFR: 52 mL/min/1.73 — ABNORMAL LOW (ref >59)

## 2024-06-29 LAB — CBC WITH DIFFERENTIAL/PLATELET
Basophils Absolute: 0 x10E3/uL (ref 0.0–0.2)
Basos: 0 %
EOS (ABSOLUTE): 0 x10E3/uL (ref 0.0–0.4)
Eos: 0 %
Hematocrit: 42.4 % (ref 34.0–46.6)
Hemoglobin: 14 g/dL (ref 11.1–15.9)
Immature Grans (Abs): 0 x10E3/uL (ref 0.0–0.1)
Immature Granulocytes: 0 %
Lymphocytes Absolute: 1.5 x10E3/uL (ref 0.7–3.1)
Lymphs: 40 %
MCH: 31.1 pg (ref 26.6–33.0)
MCHC: 33 g/dL (ref 31.5–35.7)
MCV: 94 fL (ref 79–97)
Monocytes Absolute: 0.7 x10E3/uL (ref 0.1–0.9)
Monocytes: 17 %
Neutrophils Absolute: 1.6 x10E3/uL (ref 1.4–7.0)
Neutrophils: 42 %
Platelets: 146 x10E3/uL — ABNORMAL LOW (ref 150–450)
RBC: 4.5 x10E6/uL (ref 3.77–5.28)
RDW: 12.1 % (ref 11.7–15.4)
WBC: 3.8 x10E3/uL (ref 3.4–10.8)

## 2024-06-29 LAB — LIPID PANEL W/O CHOL/HDL RATIO
Cholesterol, Total: 165 mg/dL (ref 100–199)
HDL: 49 mg/dL (ref >39)
LDL Chol Calc (NIH): 93 mg/dL (ref 0–99)
Triglycerides: 129 mg/dL (ref 0–149)
VLDL Cholesterol Cal: 23 mg/dL (ref 5–40)

## 2024-06-29 LAB — VITAMIN B12: Vitamin B-12: 948 pg/mL (ref 232–1245)

## 2024-06-29 LAB — T4, FREE: Free T4: 1.13 ng/dL (ref 0.82–1.77)

## 2024-06-29 LAB — VITAMIN D 25 HYDROXY (VIT D DEFICIENCY, FRACTURES): Vit D, 25-Hydroxy: 52.5 ng/mL (ref 30.0–100.0)

## 2024-06-29 LAB — TSH: TSH: 2.35 u[IU]/mL (ref 0.450–4.500)

## 2024-06-29 MED ORDER — LEVOTHYROXINE SODIUM 50 MCG PO TABS: 50.0000 ug | ORAL_TABLET | Freq: Every day | ORAL | 4 refills | Status: AC

## 2024-06-29 NOTE — Progress Notes (Signed)
 Contacted via MyChart  Good morning Wanda White, your labs have returned: - Kidney function, creatinine and eGFR, continues to show Stage 3a kidney dosease with no worsening. Continue to take Olmesartan .  -- CBC continues to show mildly low platelet count, avoid Aspirin use at this time. - Lipid panel continue to show LDL above goal for stroke prevention.  I would recommend either increasing Simvastatin  dose OR changing over to Rosuvastatin which may offer more benefit.  Thoughts? - Remainder of labs stable, no changes needed.  Any questions? Keep being stellar!!  Thank you for allowing me to participate in your care.  I appreciate you. Kindest regards, Tremel Setters

## 2024-07-02 MED ORDER — ROSUVASTATIN CALCIUM 20 MG PO TABS: 20.0000 mg | ORAL_TABLET | Freq: Every day | ORAL | 3 refills | Status: DC

## 2024-08-25 ENCOUNTER — Ambulatory Visit
Admission: EM | Admit: 2024-08-25 | Discharge: 2024-08-25 | Disposition: A | Discharge status: Home / Self Care | Attending: Emergency Medicine | Admitting: Emergency Medicine

## 2024-08-25 ENCOUNTER — Encounter: Payer: Self-pay | Admitting: Emergency Medicine

## 2024-08-25 DIAGNOSIS — R0981 Nasal congestion: Secondary | ICD-10-CM

## 2024-08-25 DIAGNOSIS — J069 Acute upper respiratory infection, unspecified: Secondary | ICD-10-CM | POA: Diagnosis not present

## 2024-08-25 MED ORDER — PROMETHAZINE-DM 6.25-15 MG/5ML PO SYRP: 5.0000 mL | ORAL_SOLUTION | Freq: Four times a day (QID) | ORAL | 0 refills | Status: AC | PRN

## 2024-08-25 MED ORDER — BENZONATATE 100 MG PO CAPS: 200.0000 mg | ORAL_CAPSULE | Freq: Three times a day (TID) | ORAL | 0 refills | Status: AC

## 2024-08-25 MED ORDER — IPRATROPIUM BROMIDE 0.06 % NA SOLN: 2.0000 | Freq: Four times a day (QID) | NASAL | 12 refills | Status: AC

## 2024-08-25 MED ORDER — DOXYCYCLINE HYCLATE 100 MG PO CAPS: 100.0000 mg | ORAL_CAPSULE | Freq: Two times a day (BID) | ORAL | 0 refills | Status: AC

## 2024-08-25 NOTE — Discharge Instructions (Addendum)
 Take the doxycycline  twice daily with food for 7 days for treatment of your upper respiratory tract infection.  Use over-the-counter Tylenol according to the package instructions as needed for any fever or pain.  Use the Atrovent nasal spray, 2 squirts in each nostril every 6 hours, as needed for runny nose and postnasal drip.  Use the Tessalon  Perles every 8 hours during the day.  Take them with a small sip of water.  They may give you some numbness to the base of your tongue or a metallic taste in your mouth, this is normal.  Use the Promethazine  DM cough syrup at bedtime for cough and congestion.  It will make you drowsy so do not take it during the day.  Return for reevaluation or see your primary care provider for any new or worsening symptoms.

## 2024-08-25 NOTE — ED Provider Notes (Signed)
 [facility]    CSN: [id number] Arrival date & time: 08/25/24  0802      History   Chief Complaint Chief Complaint  Patient presents with   Nasal Congestion   Cough    HPI Wanda White is a 78 y.o. female.   HPI  78 year old female with past medical history significant for hyperlipidemia, Hashimoto's thyroiditis, asthma, hypertension, obesity, CKD stage III, elevated hemoglobin A1c, thrombocytopenia, and vitamin D  deficiency presents for evaluation of respiratory symptoms that started 1 week ago and include nasal congestion with clear to yellow nasal discharge, productive cough of clear sputum, and wheezing.  She denies fever, ear pain, sore throat, or shortness of breath.  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      Home Medications    Prior to Admission medications   Medication Sig Start Date End Date Taking? Authorizing Provider  benzonatate  (TESSALON ) 100 MG capsule Take 2 capsules (200 mg total) by mouth every 8 (eight) hours. 08/25/24  Yes Bernardino Ditch, NP  doxycycline  (VIBRAMYCIN ) 100 MG capsule Take 1 capsule (100 mg  total) by mouth 2 (two) times daily for 7 days. 08/25/24 09/01/24 Yes Bernardino Ditch, NP  ipratropium (ATROVENT ) 0.06 % nasal spray Place 2 sprays into both nostrils 4 (four) times daily. 08/25/24  Yes Bernardino Ditch, NP  promethazine -dextromethorphan (PROMETHAZINE -DM) 6.25-15 MG/5ML syrup Take 5 mLs by mouth 4 (four) times daily as needed. 08/25/24  Yes Bernardino Ditch, NP  aspirin EC 81 MG tablet Take 81 mg by mouth daily.    [provider]  Boswellia-Glucosamine-Vit D (OSTEO BI-FLEX ONE PER DAY PO) Take 2 tablets by mouth daily.    [provider]  Calcium  Carb-Cholecalciferol (CALCIUM  + D3) 600-200 MG-UNIT TABS Take 600 mg by mouth 2 (two) times daily. Reported on 01/19/2016    [provider]  Cholecalciferol (VITAMIN D -3 PO) Take 1,000 Units by mouth daily. Reported on 01/19/2016    [provider]  levothyroxine  (SYNTHROID ) 50 MCG tablet Take 1 tablet (50 mcg total) by mouth daily. 06/29/24   Cannady, Jolene T, NP  Magnesium 250 MG TABS Take 250 mg by mouth daily. Reported on 01/19/2016    [provider]  Multiple Vitamin (MULTIVITAMIN) tablet Take 1 tablet by mouth daily. Reported on 01/19/2016    [provider]  olmesartan  (BENICAR ) 5 MG tablet Take 1 tablet (5 mg total) by mouth daily. 05/30/24   Cannady, Jolene T, NP  Omega-3 Fatty Acids (FISH OIL) 1000 MG CAPS Take 1,000 mg by mouth  2 (two) times daily. Reported on 01/19/2016    [provider]  rosuvastatin  (CRESTOR ) 20 MG tablet Take 1 tablet (20 mg total) by mouth daily. 07/02/24   Cannady, Jolene T, NP  vitamin C (ASCORBIC ACID) 500 MG tablet Take 500 mg by mouth daily. Reported on 01/19/2016    [provider]  vitamin E 400 UNIT capsule Take 400 Units by mouth daily. Reported on 01/19/2016    [provider]    Family History Family History  Problem Relation Age of Onset   Cancer Mother 17       breast   Thyroid  disease Mother    CAD Father    Hypertension  Father    Heart attack Father    Heart attack Maternal Grandfather    Heart disease Paternal Grandmother    Lung disease Paternal Grandfather    Cancer Sister        breast   Hypertension Sister     Social History Social History   Tobacco Use   Smoking status: Former    Current packs/day: 0.00    Types: Cigarettes    Start date: 09/26/1990    Quit date: 09/26/2010    Years since quitting: 13.9   Smokeless tobacco: Never  Vaping Use   Vaping status: Never Used  Substance Use Topics   Alcohol use: Yes    Comment: occasionally/socially   Drug use: No     Allergies   Amoxicillin   Review of Systems Review of Systems  Constitutional:  Negative for fever.  HENT:  Positive for congestion, postnasal drip and rhinorrhea. Negative for ear pain and sore throat.   Respiratory:  Positive for cough and wheezing. Negative for shortness of breath.      Physical Exam Triage Vital Signs ED Triage Vitals  Encounter Vitals Group     BP      Girls Systolic BP Percentile      Girls Diastolic BP Percentile      Boys Systolic BP Percentile      Boys Diastolic BP Percentile      Pulse      Resp      Temp      Temp src      SpO2      Weight      Height      Head Circumference      Peak Flow      Pain Score      Pain Loc      Pain Education      Exclude from Growth Chart    No data found.  Updated Vital Signs BP 137/86 (BP Location: Right Arm)   Pulse 89   Temp 98.8 F (37.1 C) (Oral)   Resp 15   Ht 5' 7.5 (1.715 m)   Wt 267 lb 10.2 oz (121.4 kg)   LMP  (LMP Unknown)   SpO2 95%   BMI 41.30 kg/m   Visual Acuity Right Eye Distance:   Left Eye Distance:   Bilateral Distance:    Right Eye Near:   Left Eye Near:    Bilateral Near:     Physical Exam Vitals and nursing note reviewed.  Constitutional:      Appearance: Normal appearance. She is not ill-appearing.  HENT:     Head: Normocephalic and atraumatic.     Right Ear: Tympanic membrane, ear canal and  external ear normal. There is no impacted cerumen.     Left Ear: Tympanic membrane,  ear canal and external ear normal. There is no impacted cerumen.     Nose: Congestion and rhinorrhea present.     Comments: Nasal mucosa is edematous and erythematous with yellow discharge in both naris.    Mouth/Throat:     Mouth: Mucous membranes are moist.     Pharynx: Oropharynx is clear. Posterior oropharyngeal erythema present. No oropharyngeal exudate.     Comments: Erythema to the posterior pharynx with yellow postnasal drip. Cardiovascular:     Rate and Rhythm: Normal rate and regular rhythm.     Pulses: Normal pulses.     Heart sounds: Normal heart sounds. No murmur heard.    No friction rub. No gallop.  Pulmonary:     Effort: Pulmonary effort is normal.     Breath sounds: Normal breath sounds. No wheezing, rhonchi or rales.  Musculoskeletal:     Cervical back: Normal range of motion and neck supple. No tenderness.  Lymphadenopathy:     Cervical: No cervical adenopathy.  Skin:    General: Skin is warm and dry.     Capillary Refill: Capillary refill takes less than 2 seconds.     Findings: No erythema or rash.  Neurological:     General: No focal deficit present.     Mental Status: She is alert and oriented to person, place, and time.      UC Treatments / Results  Labs (all labs ordered are listed, but only abnormal results are displayed) Labs Reviewed - No data to display  EKG   Radiology No results found.  Procedures Procedures (including critical care time)  Medications Ordered in UC Medications - No data to display  Initial Impression / Assessment and Plan / UC Course  I have reviewed the triage vital signs and the nursing notes.  Pertinent labs & imaging results that were available during my care of the patient were reviewed by me and considered in my medical decision making (see chart for details).   Patient is a nontoxic-appearing 78 year old female presenting for  evaluation of 1 week worth of respiratory symptoms as outlined in HPI above.  She is not in any acute distress and is able to speak in full sentences without dyspnea or tachypnea.  Respiratory rate at triage was 15 with a 95% room air oxygen saturation.  She is afebrile with oral temp of 98.8.  Her physical exam does reveal inflammation of her upper respiratory tract as well as yellow discharge in both nares as well as yellow postnasal drip in the posterior oropharynx.  Her cardiopulmonary exam reveals: Sounds in all fields.  Given that patient has been experiencing symptoms for a week I do feel a trial of antibiotics is warranted.  She reports that amoxicillin gives her yeast infection so she would prefer not to have amoxicillin.  She has been treated successfully with doxycycline  in the past upon review of records in epic.  Therefore, I will discharge her home on doxycycline  100 mg twice daily for 7 days along with Atrovent  nasal spray for her congestion and Tessalon  Perles and Promethazine  DM cough syrup for cough and congestion.   Final Clinical Impressions(s) / UC Diagnoses   Final diagnoses:  Nasal congestion  URI with cough and congestion     Discharge Instructions      Take the doxycycline  twice daily with food for 7 days for treatment of your upper respiratory tract infection.  Use over-the-counter Tylenol according to the package instructions as needed for any fever or pain.  Use the Atrovent nasal spray, 2 squirts in each nostril every 6 hours, as needed for runny nose and postnasal drip.  Use the Tessalon  Perles every 8 hours during the day.  Take them with a small sip of water.  They may give you some numbness to the base of your tongue or a metallic taste in your mouth, this is normal.  Use the Promethazine  DM cough syrup at bedtime for cough and congestion.  It will make you drowsy so do not take it during the day.  Return for reevaluation or see your primary care provider for  any new or worsening symptoms.      ED Prescriptions     Medication Sig Dispense Auth. Provider   benzonatate  (TESSALON ) 100 MG capsule Take 2 capsules (200 mg total) by mouth every 8 (eight) hours. 21 capsule Bernardino Ditch, NP   doxycycline  (VIBRAMYCIN ) 100 MG capsule Take 1 capsule (100 mg total) by mouth 2 (two) times daily for 7 days. 14 capsule Bernardino Ditch, NP   ipratropium (ATROVENT) 0.06 % nasal spray Place 2 sprays into both nostrils 4 (four) times daily. 15 mL Bernardino Ditch, NP   promethazine -dextromethorphan (PROMETHAZINE -DM) 6.25-15 MG/5ML syrup Take 5 mLs by mouth 4 (four) times daily as needed. 118 mL Bernardino Ditch, NP      PDMP not reviewed this encounter.   Bernardino Ditch, NP 08/25/24 [phone]

## 2024-08-25 NOTE — ED Triage Notes (Signed)
 Patient c/o cough, nasal congestion, post nasal drip that started a week ago.  Patient denies fevers.

## 2024-08-29 ENCOUNTER — Ambulatory Visit

## 2024-09-02 ENCOUNTER — Ambulatory Visit

## 2024-09-02 ENCOUNTER — Ambulatory Visit: Payer: Self-pay

## 2024-09-02 VITALS — BP 126/79 | HR 74 | Temp 98.4°F | Resp 15 | Ht 67.52 in | Wt 274.6 lb

## 2024-09-02 DIAGNOSIS — R051 Acute cough: Secondary | ICD-10-CM | POA: Diagnosis not present

## 2024-09-02 MED ORDER — AZITHROMYCIN 250 MG PO TABS: ORAL_TABLET | ORAL | 0 refills | Status: AC

## 2024-09-02 MED ORDER — PREDNISONE 10 MG PO TABS: ORAL_TABLET | ORAL | 0 refills | Status: AC

## 2024-09-02 NOTE — Telephone Encounter (Signed)
 FYI Only or Action Required?: FYI only for provider: appointment scheduled on 09/02/2024.  Patient was last seen in primary care on 06/28/2024 by Valerio Melanie DASEN, NP.  Called Nurse Triage reporting Sinusitis.  Symptoms began several weeks ago.  Interventions attempted: Prescription medications: benzonatate  (TESSALON ) 100 MG capsule, ipratropium (ATROVENT ) 0.06 % nasal spray, promethazine -dextromethorphan (PROMETHAZINE -DM) 6.25-15 MG/5ML syrup, doxycycline  (VIBRAMYCIN ) 100 MG capsule.  Symptoms are: unchanged.  Triage Disposition: See Physician Within 24 Hours  Patient/caregiver understands and will follow disposition?: Yes           Copied from CRM [id number]. Topic: Appointments - Appointment Scheduling >> Sep 02, 2024  9:30 AM Victoria B wrote: Patient called in was at urgent care 11/30 states antibiotic isnt helping still has sinus draining, no appt until Dec 12, she I asking for another round of antibiotics or something different to help Reason for Disposition  [1] Taking antibiotic > 72 hours (3 days) AND [2] sinus pain not improved  Answer Assessment - Initial Assessment Questions This RN scheduled pt an appointment today in office.   Pt was seen in UC on 11/30 and prescribed doxycycline  (finished on Sat). Pt states her symptoms have remained the same. Pt states this happened last year and it was treated with azithromycin . Pt states she is not sure if she needs something stronger  Onset: 11/24  Dry cough  PAIN: How bad is the pain?   (Scale 0-10; or none, mild, moderate or severe)     Sinus pressure  FEVER: Do you have a fever? If Yes, ask: What is it, how was it measured, and when did it start?      Denies  SYMPTOMS: Are there any other symptoms you're concerned about? If Yes, ask: When did it start?     Has to breathe through mouth as nose is stopped up; denies chest pain  Protocols used: Sinus Infection on Antibiotic Follow-up Call-[facility]

## 2024-09-02 NOTE — Telephone Encounter (Signed)
 Noted

## 2024-09-02 NOTE — Assessment & Plan Note (Signed)
 Ongoing x 2 weeks after antibiotic treatment with rhonchi in lungs.  Will send Z-pack and prednisone .

## 2024-09-02 NOTE — Progress Notes (Unsigned)
 BP 126/79 (BP Location: Left Arm, Patient Position: Sitting, Cuff Size: Large)   Pulse 74   Temp 98.4 F (36.9 C) (Oral)   Resp 15   Ht 5' 7.52 (1.715 m)   Wt 274 lb 9.6 oz (124.6 kg)   LMP  (LMP Unknown)   SpO2 96%   BMI 42.35 kg/m    Subjective:    Patient ID: Wanda White, female    DOB: 07-29-1946, 78 y.o.   MRN: [id number]  HPI: Wanda LAGERSTROM is a 78 y.o. female started URI and cold symptoms before thanksgiving.  Coughing started Sunday after thanksgiving.  Seen by UC, and started on doxycycline  by UC with some improvement.  Finished course of abx 2 days ago and is feeling sick again.  Coughing whitish sputum.  Denies fever/chills/myalgias recently.  No fever recently.  She feels facial pain, ears foggy. At home covid test neg. [facility]  Chief Complaint  Patient presents with   Facial Pain    Started 08/19/2024 with sneezing and coughing. Was seen in UC. One round of medication for 7 days. Still feels the same but not sneezing. Sinus pressure but not pain.     Relevant past medical, surgical, family and social history reviewed and updated as indicated. Interim medical history since our last visit reviewed. Allergies and medications reviewed and updated.  Review of Systems  Constitutional:  Positive for fatigue. Negative for chills and fever.  HENT:  Positive for congestion, ear pain, postnasal drip, rhinorrhea, sinus pressure, sinus pain and sneezing.   Eyes:  Positive for discharge.  Respiratory:  Positive for cough. Negative for shortness of breath, wheezing and stridor.   Cardiovascular:  Negative for chest pain and palpitations.  Gastrointestinal:  Negative for diarrhea, nausea and vomiting.  Genitourinary: Negative.   Neurological:  Positive for headaches.    Per HPI unless specifically indicated above     Objective:    BP 126/79 (BP Location: Left Arm, Patient Position: Sitting, Cuff Size: Large)   Pulse 74   Temp 98.4 F (36.9 C) (Oral)    Resp 15   Ht 5' 7.52 (1.715 m)   Wt 274 lb 9.6 oz (124.6 kg)   LMP  (LMP Unknown)   SpO2 96%   BMI 42.35 kg/m   Wt Readings from Last 3 Encounters:  09/02/24 274 lb 9.6 oz (124.6 kg)  08/25/24 267 lb 10.2 oz (121.4 kg)  06/28/24 267 lb 9.6 oz (121.4 kg)    Physical Exam Constitutional:      Appearance: She is obese.  HENT:     Right Ear: Tympanic membrane normal.     Left Ear: Tympanic membrane normal.     Nose: Congestion and rhinorrhea present.     Mouth/Throat:     Mouth: Mucous membranes are moist.     Pharynx: No oropharyngeal exudate or posterior oropharyngeal erythema.  Cardiovascular:     Rate and Rhythm: Normal rate and regular rhythm.  Pulmonary:     Breath sounds: No stridor. Rhonchi and rales present. No wheezing.     Comments: Productive cough with deep respirations. Abdominal:     Palpations: Abdomen is soft.     Tenderness: There is no abdominal tenderness.  Musculoskeletal:     Cervical back: Normal range of motion and neck supple.     Right lower leg: No edema.     Left lower leg: No edema.  Neurological:     General: No focal deficit present.  Mental Status: She is alert.     Results for orders placed or performed in visit on 06/28/24  Bayer DCA Hb A1c Waived   Collection Time: 06/28/24  1:36 PM  Result Value Ref Range   HB A1C (BAYER DCA - WAIVED) 5.8 (H) 4.8 - 5.6 %  Microalbumin, Urine Waived   Collection Time: 06/28/24  1:36 PM  Result Value Ref Range   Microalb, Ur Waived 150 (H) 0 - 19 mg/L   Creatinine, Urine Waived 200 10 - 300 mg/dL   Microalb/Creat Ratio 30-300 (H) <30 mg/g  CBC with Differential/Platelet   Collection Time: 06/28/24  1:37 PM  Result Value Ref Range   WBC 3.8 3.4 - 10.8 x10E3/uL   RBC 4.50 3.77 - 5.28 x10E6/uL   Hemoglobin 14.0 11.1 - 15.9 g/dL   Hematocrit 57.5 65.9 - 46.6 %   MCV 94 79 - 97 fL   MCH 31.1 26.6 - 33.0 pg   MCHC 33.0 31.5 - 35.7 g/dL   RDW 87.8 88.2 - 84.5 %   Platelets 146 (L) 150 - 450  x10E3/uL   Neutrophils 42 Not Estab. %   Lymphs 40 Not Estab. %   Monocytes 17 Not Estab. %   Eos 0 Not Estab. %   Basos 0 Not Estab. %   Neutrophils Absolute 1.6 1.4 - 7.0 x10E3/uL   Lymphocytes Absolute 1.5 0.7 - 3.1 x10E3/uL   Monocytes Absolute 0.7 0.1 - 0.9 x10E3/uL   EOS (ABSOLUTE) 0.0 0.0 - 0.4 x10E3/uL   Basophils Absolute 0.0 0.0 - 0.2 x10E3/uL   Immature Granulocytes 0 Not Estab. %   Immature Grans (Abs) 0.0 0.0 - 0.1 x10E3/uL  Comprehensive metabolic panel with GFR   Collection Time: 06/28/24  1:37 PM  Result Value Ref Range   Glucose 105 (H) 70 - 99 mg/dL   BUN 12 8 - 27 mg/dL   Creatinine, Ser 8.89 (H) 0.57 - 1.00 mg/dL   eGFR 52 (L) >40 fO/fpw/8.26   BUN/Creatinine Ratio 11 (L) 12 - 28   Sodium 137 134 - 144 mmol/L   Potassium 4.5 3.5 - 5.2 mmol/L   Chloride 100 96 - 106 mmol/L   CO2 24 20 - 29 mmol/L   Calcium  9.9 8.7 - 10.3 mg/dL   Total Protein 7.3 6.0 - 8.5 g/dL   Albumin 4.8 3.8 - 4.8 g/dL   Globulin, Total 2.5 1.5 - 4.5 g/dL   Bilirubin Total 0.5 0.0 - 1.2 mg/dL   Alkaline Phosphatase 66 49 - 135 IU/L   AST 23 0 - 40 IU/L   ALT 20 0 - 32 IU/L  Lipid Panel w/o Chol/HDL Ratio   Collection Time: 06/28/24  1:37 PM  Result Value Ref Range   Cholesterol, Total 165 100 - 199 mg/dL   Triglycerides 870 0 - 149 mg/dL   HDL 49 >60 mg/dL   VLDL Cholesterol Cal 23 5 - 40 mg/dL   LDL Chol Calc (NIH) 93 0 - 99 mg/dL  TSH   Collection Time: 06/28/24  1:37 PM  Result Value Ref Range   TSH 2.350 0.450 - 4.500 uIU/mL  VITAMIN D  25 Hydroxy (Vit-D Deficiency, Fractures)   Collection Time: 06/28/24  1:37 PM  Result Value Ref Range   Vit D, 25-Hydroxy 52.5 30.0 - 100.0 ng/mL  T4, free   Collection Time: 06/28/24  1:37 PM  Result Value Ref Range   Free T4 1.13 0.82 - 1.77 ng/dL  Vitamin B12   Collection Time: 06/28/24  1:37 PM  Result Value Ref Range   Vitamin B-12 948 232 - 1,245 pg/mL      Assessment & Plan:   Assessment & Plan Acute cough Ongoing x 2  weeks after antibiotic treatment with rhonchi in lungs.  Will send Z-pack and prednisone .     Follow up plan: Return in about 1 week (around 09/09/2024) for lung recheck - with anyone.

## 2024-09-03 ENCOUNTER — Other Ambulatory Visit (HOSPITAL_COMMUNITY): Payer: Self-pay

## 2024-09-09 ENCOUNTER — Ambulatory Visit

## 2024-09-11 ENCOUNTER — Ambulatory Visit

## 2024-09-11 VITALS — BP 110/70 | HR 97 | Temp 98.0°F | Ht 67.5 in | Wt 248.8 lb

## 2024-09-11 DIAGNOSIS — R051 Acute cough: Secondary | ICD-10-CM

## 2024-09-11 NOTE — Progress Notes (Signed)
 BP 100/68   Pulse 97   Temp 98 F (36.7 C) (Oral)   Ht 5' 7.5 (1.715 m)   Wt 248 lb 12.8 oz (112.9 kg)   LMP  (LMP Unknown)   SpO2 97%   BMI 38.39 kg/m    Subjective:    Patient ID: Wanda White, female    DOB: August 06, 1946, 78 y.o.   MRN: [id number]  HPI: Wanda White is a 78 y.o. female  Chief Complaint  Patient presents with   Cough    Follow up- patient states she is feeling a lot better     Relevant past medical, surgical, family and social history reviewed and updated as indicated. Interim medical history since our last visit reviewed. Allergies and medications reviewed and updated.  Review of Systems  Constitutional:  Negative for chills, fatigue and fever.  HENT:  Positive for postnasal drip. Negative for congestion, sore throat and voice change.   Eyes: Negative.   Respiratory:  Positive for cough. Negative for shortness of breath, wheezing and stridor.   Cardiovascular:  Negative for chest pain.  Gastrointestinal: Negative.   Neurological:  Negative for dizziness.    Per HPI unless specifically indicated above     Objective:    BP 100/68   Pulse 97   Temp 98 F (36.7 C) (Oral)   Ht 5' 7.5 (1.715 m)   Wt 248 lb 12.8 oz (112.9 kg)   LMP  (LMP Unknown)   SpO2 97%   BMI 38.39 kg/m   Wt Readings from Last 3 Encounters:  09/11/24 248 lb 12.8 oz (112.9 kg)  09/02/24 274 lb 9.6 oz (124.6 kg)  08/25/24 267 lb 10.2 oz (121.4 kg)    Physical Exam Constitutional:      General: She is not in acute distress.    Appearance: She is obese. She is not ill-appearing.  HENT:     Head: Normocephalic and atraumatic.     Right Ear: Tympanic membrane normal.     Left Ear: Tympanic membrane normal.     Nose: Nose normal.     Mouth/Throat:     Mouth: Mucous membranes are moist.     Pharynx: Oropharynx is clear. No oropharyngeal exudate or posterior oropharyngeal erythema.  Cardiovascular:     Rate and Rhythm: Normal rate and regular rhythm.  Pulmonary:      Effort: Pulmonary effort is normal. No respiratory distress.     Breath sounds: Normal breath sounds. No wheezing, rhonchi or rales.  Neurological:     Mental Status: She is alert.  Psychiatric:        Mood and Affect: Mood normal.     Results for orders placed or performed in visit on 06/28/24  Bayer DCA Hb A1c Waived   Collection Time: 06/28/24  1:36 PM  Result Value Ref Range   HB A1C (BAYER DCA - WAIVED) 5.8 (H) 4.8 - 5.6 %  Microalbumin, Urine Waived   Collection Time: 06/28/24  1:36 PM  Result Value Ref Range   Microalb, Ur Waived 150 (H) 0 - 19 mg/L   Creatinine, Urine Waived 200 10 - 300 mg/dL   Microalb/Creat Ratio 30-300 (H) <30 mg/g  CBC with Differential/Platelet   Collection Time: 06/28/24  1:37 PM  Result Value Ref Range   WBC 3.8 3.4 - 10.8 x10E3/uL   RBC 4.50 3.77 - 5.28 x10E6/uL   Hemoglobin 14.0 11.1 - 15.9 g/dL   Hematocrit 57.5 65.9 - 46.6 %   MCV  94 79 - 97 fL   MCH 31.1 26.6 - 33.0 pg   MCHC 33.0 31.5 - 35.7 g/dL   RDW 87.8 88.2 - 84.5 %   Platelets 146 (L) 150 - 450 x10E3/uL   Neutrophils 42 Not Estab. %   Lymphs 40 Not Estab. %   Monocytes 17 Not Estab. %   Eos 0 Not Estab. %   Basos 0 Not Estab. %   Neutrophils Absolute 1.6 1.4 - 7.0 x10E3/uL   Lymphocytes Absolute 1.5 0.7 - 3.1 x10E3/uL   Monocytes Absolute 0.7 0.1 - 0.9 x10E3/uL   EOS (ABSOLUTE) 0.0 0.0 - 0.4 x10E3/uL   Basophils Absolute 0.0 0.0 - 0.2 x10E3/uL   Immature Granulocytes 0 Not Estab. %   Immature Grans (Abs) 0.0 0.0 - 0.1 x10E3/uL  Comprehensive metabolic panel with GFR   Collection Time: 06/28/24  1:37 PM  Result Value Ref Range   Glucose 105 (H) 70 - 99 mg/dL   BUN 12 8 - 27 mg/dL   Creatinine, Ser 8.89 (H) 0.57 - 1.00 mg/dL   eGFR 52 (L) >40 fO/fpw/8.26   BUN/Creatinine Ratio 11 (L) 12 - 28   Sodium 137 134 - 144 mmol/L   Potassium 4.5 3.5 - 5.2 mmol/L   Chloride 100 96 - 106 mmol/L   CO2 24 20 - 29 mmol/L   Calcium  9.9 8.7 - 10.3 mg/dL   Total Protein 7.3 6.0 -  8.5 g/dL   Albumin 4.8 3.8 - 4.8 g/dL   Globulin, Total 2.5 1.5 - 4.5 g/dL   Bilirubin Total 0.5 0.0 - 1.2 mg/dL   Alkaline Phosphatase 66 49 - 135 IU/L   AST 23 0 - 40 IU/L   ALT 20 0 - 32 IU/L  Lipid Panel w/o Chol/HDL Ratio   Collection Time: 06/28/24  1:37 PM  Result Value Ref Range   Cholesterol, Total 165 100 - 199 mg/dL   Triglycerides 870 0 - 149 mg/dL   HDL 49 >60 mg/dL   VLDL Cholesterol Cal 23 5 - 40 mg/dL   LDL Chol Calc (NIH) 93 0 - 99 mg/dL  TSH   Collection Time: 06/28/24  1:37 PM  Result Value Ref Range   TSH 2.350 0.450 - 4.500 uIU/mL  VITAMIN D  25 Hydroxy (Vit-D Deficiency, Fractures)   Collection Time: 06/28/24  1:37 PM  Result Value Ref Range   Vit D, 25-Hydroxy 52.5 30.0 - 100.0 ng/mL  T4, free   Collection Time: 06/28/24  1:37 PM  Result Value Ref Range   Free T4 1.13 0.82 - 1.77 ng/dL  Vitamin B12   Collection Time: 06/28/24  1:37 PM  Result Value Ref Range   Vitamin B-12 948 232 - 1,245 pg/mL      Assessment & Plan:   Assessment & Plan Acute cough Presents for acute cough follow up with lung recheck. Pt completed course of Z-pack and prednisone .  Pt states she feels a lot better.  Lungs clear to auscultation, no rales.      Follow up plan: Follow up if needed.  Please keep regular appointment scheduled for April with PCP Melanie, NP.

## 2024-09-11 NOTE — Assessment & Plan Note (Signed)
 Presents for acute cough follow up with lung recheck. Pt completed course of Z-pack and prednisone .  Pt states she feels a lot better.  Lungs clear to auscultation, no rales.

## 2024-10-24 ENCOUNTER — Other Ambulatory Visit: Payer: Self-pay | Admitting: Family Medicine

## 2024-10-24 ENCOUNTER — Ambulatory Visit
Admission: RE | Admit: 2024-10-24 | Discharge: 2024-10-24 | Disposition: A | Discharge status: Home / Self Care | Attending: Family Medicine | Admitting: Family Medicine

## 2024-10-24 ENCOUNTER — Ambulatory Visit: Admitting: Family Medicine

## 2024-10-24 ENCOUNTER — Ambulatory Visit
Admission: RE | Admit: 2024-10-24 | Discharge: 2024-10-24 | Disposition: A | Discharge status: Home / Self Care | Source: Ambulatory Visit | Attending: Family Medicine | Admitting: Family Medicine

## 2024-10-24 ENCOUNTER — Encounter: Payer: Self-pay | Admitting: Family Medicine

## 2024-10-24 VITALS — BP 116/72 | HR 88 | Ht 67.5 in

## 2024-10-24 DIAGNOSIS — S82831A Other fracture of upper and lower end of right fibula, initial encounter for closed fracture: Secondary | ICD-10-CM

## 2024-10-24 DIAGNOSIS — M79604 Pain in right leg: Secondary | ICD-10-CM

## 2024-10-24 DIAGNOSIS — M25571 Pain in right ankle and joints of right foot: Secondary | ICD-10-CM

## 2024-10-24 MED ORDER — VITAMIN D (ERGOCALCIFEROL) 1.25 MG (50000 UNIT) PO CAPS: 50000.0000 [IU] | ORAL_CAPSULE | ORAL | 0 refills | Status: AC

## 2024-10-24 MED ORDER — VITAMIN D (ERGOCALCIFEROL) 1.25 MG (50000 UNIT) PO CAPS: 50000.0000 [IU] | ORAL_CAPSULE | ORAL | 0 refills | Status: DC

## 2024-10-24 NOTE — Patient Instructions (Addendum)
" °  VISIT SUMMARY: You were evaluated for a right lateral malleolus fracture and ankle sprain following a fall. You also experienced transient right knee pain. Your right ankle injury will be managed conservatively with a walking boot and other supportive measures.  YOUR PLAN: RIGHT LATERAL MALLEOLUS FRACTURE AND ANKLE SPRAIN: You have a fracture in your right ankle along with a sprain. -Wear walking boot during weight-bearing activities and remove it when non-weight-bearing or sleeping unless needed for comfort. -Use pain at the fracture site as a guide for activity; rest and elevate your ankle if pain increases. -Elevate your ankle above heart level and apply ice for swelling; use heat for comfort. -Do not drive until you are cleared to do so due to the right-sided injury. -Take high-dose vitamin D  weekly for bone healing. -Take calcium  supplements up to 2000 mg daily, reduce to 1000 mg if you experience gastrointestinal side effects. -Manage pain with acetaminophen or ibuprofen; report any uncontrolled or worsening pain. -Follow up in two weeks for repeat right ankle radiographs. -Perform gentle ankle range of motion exercises when out of the boot, but avoid weight-bearing without the boot. -Consider home-based or formal physical therapy after immobilization.  RIGHT KNEE OSTEOARTHRITIS: You have chronic osteoarthritis in your right knee, which may worsen due to the altered biomechanics from your ankle injury and boot use. -Continue taking Osteo Bi-Flex supplement. -Monitor for worsening knee pain, and rest and elevate your knee as needed. -Consider physical therapy for knee and ankle rehabilitation after your fracture heals.    Contains text generated by [employer].   "

## 2024-10-24 NOTE — Assessment & Plan Note (Signed)
" °  Orders:   DG Ankle Complete Right; Future   Vitamin D , Ergocalciferol , (DRISDOL ) 1.25 MG (50000 UNIT) CAPS capsule; Take 1 capsule (50,000 Units total) by mouth every 7 (seven) days. Take for 8 total doses(weeks)  "

## 2024-10-24 NOTE — Progress Notes (Signed)
 "    Primary Care / Sports Medicine Office Visit  Patient Information:  Patient ID: Wanda White, female DOB: 02-07-1946 Age: 79 y.o. MRN: [id number]   Wanda White is a pleasant 79 y.o. female presenting with the following:  Chief Complaint  Patient presents with   Ankle Pain    Patient had fall today and inured her right ankle. Ankle pain on medial and lateral aspect with movement. Patient has no pain with flex/ext.    Knee Pain    Patient right knee pain since fall earlier today.     Vitals:   10/24/24 1406  BP: 116/72  Pulse: 88  SpO2: 97%   Vitals:   10/24/24 1406  Height: 5' 7.5 (1.715 m)   Body mass index is 38.39 kg/m.  No results found.   Independent interpretation of notes and tests performed by another provider:   Results Radiology Right ankle x-ray (10/24/2024): Nondisplaced fracture of the right lateral malleolus with cortical step-off and oblique curvilinear lucency, no displacement. (Independently interpreted) Right knee x-ray (10/24/2024): No fracture. Mild medial compartment joint space narrowing consistent with osteoarthritis. (Independently interpreted)  Procedures performed:   None  Pertinent History, Exam, Impression, and Recommendations:   Discussed the use of AI scribe software for clinical note transcription with the patient, who gave verbal consent to proceed.  History of Present Illness   Wanda White is a 79 year old female with prior left ankle fracture and right shoulder reconstruction who presents for evaluation of right lateral malleolus fracture and ankle sprain following a fall.  Right Ankle Pain and Swelling: - Sustained inversion injury to the right ankle after slipping on ice - Immediate, severe pain localized to the lateral aspect of the ankle - Unable to bear weight and requires assistance to ambulate - Acute swelling developed and has persisted - Ambulates with a cane and avoids weight-bearing due to increased pain -  Dangling the foot provides partial relief; resting on a footrest worsens discomfort - Elevates ankle and applies ice; swelling increases throughout the day - Occasional tightness of the boot  Right Knee Symptoms: - Experienced transient stinging pain at the medial aspect of the right knee immediately following the fall - No persistent knee pain or swelling  Musculoskeletal History: - Prior left ankle injury in June 2010, initially diagnosed as a sprain, later found to be a hairline fracture - Managed with a boot for four to five weeks with good symptom relief - Unable to locate previous boot - History of right shoulder reconstruction for severe fracture, including open reduction 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 Conservative management plan. - Placed in tall walking boot for immobilization and protection. - Instructed to wear boot during weight-bearing activities, remove when non-weight-bearing or sleeping unless needed for comfort. - Advised to use pain at fracture site as activity guide; rest and elevate if pain increases. - Recommended elevation above heart level and ice for swelling; heat for comfort. - Advised against driving until cleared due to right-sided injury. - Prescribed high-dose vitamin D  weekly. - Recommended calcium  supplementation to 2000 mg daily, reduce to 1000 mg if GI side effects occur. - Instructed to continue Osteo Bi-Flex for joint health. - Provided pain management instructions with acetaminophen or  ibuprofen; report uncontrolled or worsening pain. - Ordered two-week follow-up with repeat right ankle radiographs. - Instructed on gentle ankle ROM exercises when out of boot, avoid weight-bearing without boot. -  Discussed potential for home-based or formal physical therapy post-immobilization.  Right knee osteoarthritis Chronic right knee osteoarthritis with medial joint space narrowing. Symptoms may worsen due to altered biomechanics from ankle injury and boot use. - Advised that walking boot may exacerbate knee osteoarthritis symptoms. - Recommended continuation of Osteo Bi-Flex supplement. - Encouraged monitoring for worsening knee pain, rest and elevate as needed. - Discussed potential benefit of physical therapy for knee and ankle rehabilitation post-fracture healing.        Assessment & Plan Other closed fracture of distal end of right fibula, initial encounter  Orders:   DG Ankle Complete Right; Future   Vitamin D , Ergocalciferol , (DRISDOL ) 1.25 MG (50000 UNIT) CAPS capsule; Take 1 capsule (50,000 Units total) by mouth every 7 (seven) days. Take for 8 total doses(weeks)  Acute pain of right lower extremity      No follow-ups on file.     Selinda JINNY Ku, MD, [facility]   Primary Care Sports Medicine Primary Care and Sports Medicine at [facility]   "

## 2024-11-07 ENCOUNTER — Ambulatory Visit: Admitting: Family Medicine

## 2024-12-27 ENCOUNTER — Ambulatory Visit: Admitting: Nurse Practitioner
